# Patient Record
Sex: Male | Born: 1940
Health system: Southern US, Community
[De-identification: ages and names within clinical notes are randomized; demographics above are authoritative.]

## PROBLEM LIST (undated history)

## (undated) DIAGNOSIS — G2581 Restless legs syndrome: Secondary | ICD-10-CM

## (undated) DIAGNOSIS — H409 Unspecified glaucoma: Secondary | ICD-10-CM

## (undated) DIAGNOSIS — C189 Malignant neoplasm of colon, unspecified: Secondary | ICD-10-CM

## (undated) DIAGNOSIS — M4807 Spinal stenosis, lumbosacral region: Secondary | ICD-10-CM

## (undated) DIAGNOSIS — R011 Cardiac murmur, unspecified: Secondary | ICD-10-CM

## (undated) HISTORY — PX: HIP SURGERY: SHX245

## (undated) HISTORY — DX: Spinal stenosis, lumbosacral region: M48.07

## (undated) HISTORY — DX: Unspecified glaucoma: H40.9

## (undated) HISTORY — DX: Malignant neoplasm of colon, unspecified: C18.9

## (undated) HISTORY — PX: HERNIA REPAIR: SHX51

## (undated) HISTORY — DX: Restless legs syndrome: G25.81

---

## 2011-05-07 HISTORY — PX: BACK SURGERY: SHX140

## 2013-02-10 ENCOUNTER — Other Ambulatory Visit: Payer: Self-pay | Admitting: Orthopaedic Surgery

## 2013-02-10 DIAGNOSIS — M5136 Other intervertebral disc degeneration, lumbar region: Secondary | ICD-10-CM

## 2013-02-22 ENCOUNTER — Ambulatory Visit
Admission: RE | Admit: 2013-02-22 | Discharge: 2013-02-22 | Disposition: A | Discharge status: Home / Self Care | Payer: Medicare Other | Source: Ambulatory Visit | Attending: Orthopaedic Surgery | Admitting: Orthopaedic Surgery

## 2013-02-22 DIAGNOSIS — M5136 Other intervertebral disc degeneration, lumbar region: Secondary | ICD-10-CM

## 2013-02-22 MED ORDER — GADOBENATE DIMEGLUMINE 529 MG/ML IV SOLN
15.0000 mL | Freq: Once | INTRAVENOUS | Status: AC | PRN
  Administered 2013-02-22: 15 mL via INTRAVENOUS

## 2014-01-25 ENCOUNTER — Other Ambulatory Visit: Payer: Self-pay | Admitting: Orthopedic Surgery

## 2014-02-23 ENCOUNTER — Encounter (HOSPITAL_COMMUNITY): Admission: RE | Payer: Self-pay | Source: Ambulatory Visit

## 2014-02-23 ENCOUNTER — Inpatient Hospital Stay (HOSPITAL_COMMUNITY): Admission: RE | Admit: 2014-02-23 | Payer: Medicare Other | Source: Ambulatory Visit | Admitting: Orthopedic Surgery

## 2014-02-23 SURGERY — ARTHROPLASTY, HIP, TOTAL, ANTERIOR APPROACH
Anesthesia: Choice | Site: Hip | Laterality: Right

## 2019-05-20 DIAGNOSIS — H401131 Primary open-angle glaucoma, bilateral, mild stage: Secondary | ICD-10-CM | POA: Diagnosis not present

## 2019-06-08 DIAGNOSIS — K921 Melena: Secondary | ICD-10-CM | POA: Diagnosis not present

## 2019-06-09 DIAGNOSIS — K921 Melena: Secondary | ICD-10-CM | POA: Diagnosis not present

## 2019-06-16 DIAGNOSIS — C187 Malignant neoplasm of sigmoid colon: Secondary | ICD-10-CM | POA: Diagnosis not present

## 2019-06-16 DIAGNOSIS — Z800 Family history of malignant neoplasm of digestive organs: Secondary | ICD-10-CM | POA: Diagnosis not present

## 2019-06-16 DIAGNOSIS — K635 Polyp of colon: Secondary | ICD-10-CM | POA: Diagnosis not present

## 2019-06-16 DIAGNOSIS — K6389 Other specified diseases of intestine: Secondary | ICD-10-CM | POA: Diagnosis not present

## 2019-06-16 DIAGNOSIS — D125 Benign neoplasm of sigmoid colon: Secondary | ICD-10-CM | POA: Diagnosis not present

## 2019-06-16 DIAGNOSIS — K921 Melena: Secondary | ICD-10-CM | POA: Diagnosis not present

## 2019-06-16 DIAGNOSIS — Z1211 Encounter for screening for malignant neoplasm of colon: Secondary | ICD-10-CM | POA: Diagnosis not present

## 2019-06-17 DIAGNOSIS — C187 Malignant neoplasm of sigmoid colon: Secondary | ICD-10-CM | POA: Diagnosis not present

## 2019-06-17 DIAGNOSIS — C189 Malignant neoplasm of colon, unspecified: Secondary | ICD-10-CM | POA: Diagnosis not present

## 2019-06-18 DIAGNOSIS — C187 Malignant neoplasm of sigmoid colon: Secondary | ICD-10-CM | POA: Diagnosis not present

## 2019-06-21 DIAGNOSIS — Z1331 Encounter for screening for depression: Secondary | ICD-10-CM | POA: Diagnosis not present

## 2019-06-21 DIAGNOSIS — Z0000 Encounter for general adult medical examination without abnormal findings: Secondary | ICD-10-CM | POA: Diagnosis not present

## 2019-06-21 DIAGNOSIS — Z6827 Body mass index (BMI) 27.0-27.9, adult: Secondary | ICD-10-CM | POA: Diagnosis not present

## 2019-06-21 DIAGNOSIS — Z01818 Encounter for other preprocedural examination: Secondary | ICD-10-CM | POA: Diagnosis not present

## 2019-06-21 DIAGNOSIS — I340 Nonrheumatic mitral (valve) insufficiency: Secondary | ICD-10-CM | POA: Diagnosis not present

## 2019-06-22 DIAGNOSIS — R011 Cardiac murmur, unspecified: Secondary | ICD-10-CM | POA: Diagnosis not present

## 2019-06-22 DIAGNOSIS — I340 Nonrheumatic mitral (valve) insufficiency: Secondary | ICD-10-CM | POA: Diagnosis not present

## 2019-06-22 DIAGNOSIS — I350 Nonrheumatic aortic (valve) stenosis: Secondary | ICD-10-CM | POA: Diagnosis not present

## 2019-06-22 DIAGNOSIS — Z1159 Encounter for screening for other viral diseases: Secondary | ICD-10-CM | POA: Diagnosis not present

## 2019-06-22 DIAGNOSIS — I361 Nonrheumatic tricuspid (valve) insufficiency: Secondary | ICD-10-CM | POA: Diagnosis not present

## 2019-06-25 ENCOUNTER — Other Ambulatory Visit: Payer: Self-pay

## 2019-06-25 ENCOUNTER — Ambulatory Visit (INDEPENDENT_AMBULATORY_CARE_PROVIDER_SITE_OTHER): Payer: Medicare PPO | Admitting: Cardiology

## 2019-06-25 ENCOUNTER — Encounter: Payer: Self-pay | Admitting: Cardiology

## 2019-06-25 VITALS — BP 130/90 | HR 94 | Ht 68.0 in | Wt 182.0 lb

## 2019-06-25 DIAGNOSIS — I493 Ventricular premature depolarization: Secondary | ICD-10-CM | POA: Diagnosis not present

## 2019-06-25 DIAGNOSIS — I350 Nonrheumatic aortic (valve) stenosis: Secondary | ICD-10-CM

## 2019-06-25 DIAGNOSIS — C187 Malignant neoplasm of sigmoid colon: Secondary | ICD-10-CM | POA: Diagnosis not present

## 2019-06-25 DIAGNOSIS — N401 Enlarged prostate with lower urinary tract symptoms: Secondary | ICD-10-CM | POA: Diagnosis not present

## 2019-06-25 NOTE — Patient Instructions (Signed)
Medication Instructions:  Your physician recommends that you continue on your current medications as directed. Please refer to the Current Medication list given to you today.  *If you need a refill on your cardiac medications before your next appointment, please call your pharmacy*  Lab Work: None  If you have labs (blood work) drawn today and your tests are completely normal, you will receive your results only by: Marland Kitchen MyChart Message (if you have MyChart) OR . A paper copy in the mail If you have any lab test that is abnormal or we need to change your treatment, we will call you to review the results.  Testing/Procedures: None  Follow-Up: At [facility], you and your health needs are our priority.  As part of our continuing mission to provide you with exceptional heart care, we have created designated Provider Care Teams.  These Care Teams include your primary Cardiologist (physician) and Advanced Practice Providers (APPs -  Physician Assistants and Nurse Practitioners) who all work together to provide you with the care you need, when you need it.  Your next appointment:   1 month(s)  The format for your next appointment:   In Person  Provider:   Berniece Salines, DO  Other Instructions YOU ARE BEING REFERRED TO DR Primrose. THEY WILL CONTACT YOU WITH A DATE AND TIME FOR YOUR APPOINTMENT.

## 2019-06-25 NOTE — Progress Notes (Signed)
Cardiology Office Note:    Date:  06/26/2019   ID:  Richard Washington, DOB 06-26-1940, MRN [id number]  PCP:  Mateo Flow, MD  Cardiologist:  Berniece Salines, DO  Electrophysiologist:  None   Referring MD: No ref. provider found   Chief Complaint  Patient presents with  . Pre-op Exam    To get cancerous mass removed from colon    History of Present Illness:    Richard Washington is a 79 y.o. male presents today to be evaluated for preoperative clearance.  Per patient and his wife he has scheduled surgery scheduled for June 28, 2019 due to a colon mass.  Per records on June 21, 2019 he had a colonoscopy for hematochezia which revealed sigmoid adenocarcinoma.  Other medical history includes glaucoma, restless leg syndrome, insomnia and history of melanoma.  The patient was referred by his PCP due to echocardiogram which done at [facility] showed evidence of severe aortic stenosis.  He denies any chest pain, shortness of breath, nausea, vomiting any dizziness.  Past Medical History:  Diagnosis Date  . Carcinoma of colon ([place])   . Glaucoma   . Lumbosacral stenosis   . Restless leg     Past Surgical History:  Procedure Laterality Date  . BACK SURGERY  2013   Stenosis in the back at [place] 2001 and 2013  . HIP SURGERY Bilateral    2015 and 2016    Current Medications: No outpatient medications have been marked as taking for the 06/25/19 encounter (Office Visit) with Berniece Salines, DO.     Allergies:   Patient has no known allergies.   Social History   Socioeconomic History  . Marital status: Married    Spouse name: Not on file  . Number of children: Not on file  . Years of education: Not on file  . Highest education level: Not on file  Occupational History  . Not on file  Tobacco Use  . Smoking status: Never Smoker  . Smokeless tobacco: Former Systems developer    Types: Chew  Substance and Sexual Activity  . Alcohol use: Never  . Drug use:  Never  . Sexual activity: Not on file  Other Topics Concern  . Not on file  Social History Narrative  . Not on file   Social Determinants of Health   Financial Resource Strain:   . Difficulty of Paying Living Expenses: Not on file  Food Insecurity:   . Worried About Charity fundraiser in the Last Year: Not on file  . Ran Out of Food in the Last Year: Not on file  Transportation Needs:   . Lack of Transportation (Medical): Not on file  . Lack of Transportation (Non-Medical): Not on file  Physical Activity:   . Days of Exercise per Week: Not on file  . Minutes of Exercise per Session: Not on file  Stress:   . Feeling of Stress : Not on file  Social Connections:   . Frequency of Communication with Friends and Family: Not on file  . Frequency of Social Gatherings with Friends and Family: Not on file  . Attends Religious Services: Not on file  . Active Member of Clubs or Organizations: Not on file  . Attends Archivist Meetings: Not on file  . Marital Status: Not on file     Family History: The patient's family history includes Arrhythmia in his mother; Arthritis in his father and mother;  Colon cancer in his father; Heart attack in his father; Multiple sclerosis in his sister.  ROS:   Review of Systems  Constitution: Negative for decreased appetite, fever and weight gain.  HENT: Negative for congestion, ear discharge, hoarse voice and sore throat.   Eyes: Negative for discharge, redness, vision loss in right eye and visual halos.  Cardiovascular: Negative for chest pain, dyspnea on exertion, leg swelling, orthopnea and palpitations.  Respiratory: Negative for cough, hemoptysis, shortness of breath and snoring.   Endocrine: Negative for heat intolerance and polyphagia.  Hematologic/Lymphatic: Negative for bleeding problem. Does not bruise/bleed easily.  Skin: Negative for flushing, nail changes, rash and suspicious lesions.  Musculoskeletal: Negative for arthritis,  joint pain, muscle cramps, myalgias, neck pain and stiffness.  Gastrointestinal: Negative for abdominal pain, bowel incontinence, diarrhea and excessive appetite.  Genitourinary: Negative for decreased libido, genital sores and incomplete emptying.  Neurological: Negative for brief paralysis, focal weakness, headaches and loss of balance.  Psychiatric/Behavioral: Negative for altered mental status, depression and suicidal ideas.  Allergic/Immunologic: Negative for HIV exposure and persistent infections.    EKGs/Labs/Other Studies Reviewed:    The following studies were reviewed today:   EKG:  The ekg ordered today demonstrates sinus rhythm, heart rate 85 bpm, frequent PVCs with left axis deviation.  No prior EKG for comparison.  Transthoracic echocardiogram showed evidence of normal LV size, left ventricle wall thickness was normal, left ventricular systolic function was 55 to 60%.  Diastolic pattern indicates impaired relaxation.  Left ventricle is moderately enlarged.  Left atrium is moderately dilated.  Right atrium is normal size.  Interatrial septum intact.  Aortic valve is bicuspid and severely thickened.  There is doming of the aortic valve leaflet.  Severe aortic stenosis.  Mean gradient 41, aortic valve area 0.78, peak velocity 4.28 m/s.  Mitral valve appeared normal.  Normal regurgitation.  Mitral valve pressure half-time 1.21 cm.  Tricuspid valve normal.  Mitral valve regurgitation.  Pulmonic valve normal.  Trace pulmonary regurgitation.  Exercise normal ascending urinary visualized.  Normal inferior vena cava.  No pericardial effusion.  Recent Labs: No results found for requested labs within last 8760 hours.  Recent Lipid Panel No results found for: CHOL, TRIG, HDL, CHOLHDL, VLDL, LDLCALC, LDLDIRECT  Physical Exam:    VS:  BP 130/90 (BP Location: Right Arm, Patient Position: Sitting, Cuff Size: Normal)   Pulse 94   Ht 5\' 8"  (1.727 m)   Wt 182 lb (82.6 kg)   SpO2 98%   BMI  27.67 kg/m     Wt Readings from Last 3 Encounters:  06/25/19 182 lb (82.6 kg)     GEN: Well nourished, well developed in no acute distress HEENT: Normal NECK: No JVD; No carotid bruits LYMPHATICS: No lymphadenopathy CARDIAC: S1S2 noted,RRR, 3/6  Mid-late systolic ejection murmur, rubs, gallops RESPIRATORY:  Clear to auscultation without rales, wheezing or rhonchi  ABDOMEN: Soft, non-tender, non-distended, +bowel sounds, no guarding. EXTREMITIES: No edema, No cyanosis, no clubbing MUSCULOSKELETAL:  No deformity  SKIN: Warm and dry NEUROLOGIC:  Alert and oriented x 3, non-focal PSYCHIATRIC:  Normal affect, good insight  ASSESSMENT:    1. Severe calcific aortic stenosis   2. PVC's (premature ventricular contractions)    PLAN:     The patient does have severe aortic stenosis which I believe needs urgent attention. He denies symptoms but he may not be active enough to solicit symptoms. I am concern about his upcoming surgery. I did speak with his surgeon (Dr. Lilia Pro). If  he needs this surgery immediately he should have this done at a tertiary care center.   In the meantime, I have placed an urgent referral with our structural clinic to be evaluated for TAVR.   I explained this to the patient and his wife. At this time they would like to at least go for a visit to the structural clinic. I also informed them that I had spoken to his surgeon as well.   PVC was noted on the ekg in the office today. We will continue to monitor.   His blood pressure is acceptable for now.  The patient is in agreement with the above plan. The patient left the office in stable condition.  The patient will follow up in 1 months or sooner if needed.    Medication Adjustments/Labs and Tests Ordered: Current medicines are reviewed at length with the patient today.  Concerns regarding medicines are outlined above.  Orders Placed This Encounter  Procedures  . Ambulatory referral to Structural Heart/Valve  Clinic (only at [place])  . EKG 12-Lead   No orders of the defined types were placed in this encounter.   Patient Instructions  Medication Instructions:  Your physician recommends that you continue on your current medications as directed. Please refer to the Current Medication list given to you today.  *If you need a refill on your cardiac medications before your next appointment, please call your pharmacy*  Lab Work: None  If you have labs (blood work) drawn today and your tests are completely normal, you will receive your results only by: Marland Kitchen MyChart Message (if you have MyChart) OR . A paper copy in the mail If you have any lab test that is abnormal or we need to change your treatment, we will call you to review the results.  Testing/Procedures: None  Follow-Up: At [facility], you and your health needs are our priority.  As part of our continuing mission to provide you with exceptional heart care, we have created designated Provider Care Teams.  These Care Teams include your primary Cardiologist (physician) and Advanced Practice Providers (APPs -  Physician Assistants and Nurse Practitioners) who all work together to provide you with the care you need, when you need it.  Your next appointment:   1 month(s)  The format for your next appointment:   In Person  Provider:   Berniece Salines, DO  Other Instructions YOU ARE BEING REFERRED TO DR [place]. THEY WILL CONTACT YOU WITH A DATE AND TIME FOR YOUR APPOINTMENT.     Adopting a Healthy Lifestyle.  Know what a healthy weight is for you (roughly BMI <25) and aim to maintain this   Aim for 7+ servings of fruits and vegetables daily   65-80+ fluid ounces of water or unsweet tea for healthy kidneys   Limit to max 1 drink of alcohol per day; avoid smoking/tobacco   Limit animal fats in diet for cholesterol and heart health - choose grass fed whenever available   Avoid highly processed  foods, and foods high in saturated/trans fats   Aim for low stress - take time to unwind and care for your mental health   Aim for 150 min of moderate intensity exercise weekly for heart health, and weights twice weekly for bone health   Aim for 7-9 hours of sleep daily   When it comes to diets, agreement about the perfect plan isnt easy to find, even among the experts. Experts at the [facility]  [employer] developed an idea known as the Healthy Eating Plate. Just imagine a plate divided into logical, healthy portions.   The emphasis is on diet quality:   Load up on vegetables and fruits - one-half of your plate: Aim for color and variety, and remember that potatoes dont count.   Go for whole grains - one-quarter of your plate: Whole wheat, barley, wheat berries, quinoa, oats, brown rice, and foods made with them. If you want pasta, go with whole wheat pasta.   Protein power - one-quarter of your plate: Fish, chicken, beans, and nuts are all healthy, versatile protein sources. Limit red meat.   The diet, however, does go beyond the plate, offering a few other suggestions.   Use healthy plant oils, such as olive, canola, soy, corn, sunflower and peanut. Check the labels, and avoid partially hydrogenated oil, which have unhealthy trans fats.   If youre thirsty, drink water. Coffee and tea are good in moderation, but skip sugary drinks and limit milk and dairy products to one or two daily servings.   The type of carbohydrate in the diet is more important than the amount. Some sources of carbohydrates, such as vegetables, fruits, whole grains, and beans-are healthier than others.   Finally, stay active  Signed, Berniece Salines, DO  06/26/2019 10:14 AM    [place]

## 2019-06-26 DIAGNOSIS — I493 Ventricular premature depolarization: Secondary | ICD-10-CM | POA: Insufficient documentation

## 2019-06-26 DIAGNOSIS — I350 Nonrheumatic aortic (valve) stenosis: Secondary | ICD-10-CM | POA: Insufficient documentation

## 2019-06-28 ENCOUNTER — Telehealth: Payer: Self-pay | Admitting: Cardiovascular Disease

## 2019-06-28 NOTE — Telephone Encounter (Signed)
New message   Patient's wife is calling to get a referral setup for patient that is a structural heart valve clinic referral. Please call the patient.

## 2019-06-29 NOTE — Telephone Encounter (Signed)
Pt scheduled for Structural Heart evaluation on 07/02/2019 with Dr Angelena Form.

## 2019-07-02 ENCOUNTER — Ambulatory Visit: Payer: Medicare PPO | Admitting: Cardiovascular Disease

## 2019-07-02 ENCOUNTER — Encounter: Payer: Self-pay | Admitting: Cardiovascular Disease

## 2019-07-02 ENCOUNTER — Encounter (HOSPITAL_COMMUNITY): Payer: Self-pay | Admitting: Surgery

## 2019-07-02 ENCOUNTER — Other Ambulatory Visit: Payer: Self-pay

## 2019-07-02 VITALS — BP 146/80 | HR 92 | Ht 68.0 in | Wt 181.2 lb

## 2019-07-02 DIAGNOSIS — I350 Nonrheumatic aortic (valve) stenosis: Secondary | ICD-10-CM

## 2019-07-02 DIAGNOSIS — C187 Malignant neoplasm of sigmoid colon: Secondary | ICD-10-CM | POA: Insufficient documentation

## 2019-07-02 HISTORY — DX: Nonrheumatic aortic (valve) stenosis: I35.0

## 2019-07-02 NOTE — Patient Instructions (Signed)
We will follow up with you after your surgery!

## 2019-07-02 NOTE — Progress Notes (Signed)
Structural Heart Clinic Consult Note  Chief Complaint  Patient presents with  . New Patient (Initial Visit)    Severe aortic stenosis    History of Present Illness: 79 yo male with history of glaucoma, restless leg syndrome, melanoma, recent diagnosis of colon adenocarcinoma and new finding of severe aortic stenosis who is here today as a new consult in the structural heart clinic to discuss his aortic stenosis and possible TAVR. He had hematochezia and colonoscopy showed a sigmoid adenocarcinoma. Echo at [facility] per records showed normal LV systolic function with [id number]. The aortic valve appeared to be bicuspid with severely thickened leaflets with restricted motion and severe aortic stenosis (mean gradient 41 mmHg, AVA 0.78 cm2). He was seen in the [facility] office in [place] by Dr. Harriet Masson on 06/25/19. He had planned surgery for removal of his sigmoid colon mass but this was cancelled due to his aortic stenosis.   He tells me today that he has no chest pain, dyspnea, dizziness, near syncope, syncope or lower extremity edema. He worked in his yard all weekend with no issues. He has had no further GI bleeding. He has no active dental issues and goes to the dentist every six months. He lives in [place], [place]. He is here today with his wife. He is retired from [employer].   Primary Care Physician: Mateo Flow, MD Primary Cardiologist: Berniece Salines, MD Referring Cardiologist: Berniece Salines, MD Surgeon: Andrez Grime, MD  Past Medical History:  Diagnosis Date  . Aortic stenosis   . Carcinoma of colon ([place])   . Glaucoma   . Lumbosacral stenosis   . Restless leg     Past Surgical History:  Procedure Laterality Date  . BACK SURGERY  2013   Stenosis in the back at [place] 2001 and 2013  . HIP SURGERY Bilateral    2015 and 2016    Current Outpatient Medications  Medication Sig Dispense Refill  . latanoprost (XALATAN) 0.005 % ophthalmic  solution      No current facility-administered medications for this visit.    No Known Allergies  Social History   Socioeconomic History  . Marital status: Married    Spouse name: Not on file  . Number of children: 3  . Years of education: Not on file  . Highest education level: Not on file  Occupational History  . Occupation: Retired-Worked for [employer]  . Smoking status: Never Smoker  . Smokeless tobacco: Former Systems developer    Types: Chew  Substance and Sexual Activity  . Alcohol use: Never  . Drug use: Never  . Sexual activity: Not on file  Other Topics Concern  . Not on file  Social History Narrative  . Not on file   Social Determinants of Health   Financial Resource Strain:   . Difficulty of Paying Living Expenses: Not on file  Food Insecurity:   . Worried About Charity fundraiser in the Last Year: Not on file  . Ran Out of Food in the Last Year: Not on file  Transportation Needs:   . Lack of Transportation (Medical): Not on file  . Lack of Transportation (Non-Medical): Not on file  Physical Activity:   . Days of Exercise per Week: Not on file  . Minutes of Exercise per Session: Not on file  Stress:   . Feeling of Stress : Not on file  Social Connections:   . Frequency  of Communication with Friends and Family: Not on file  . Frequency of Social Gatherings with Friends and Family: Not on file  . Attends Religious Services: Not on file  . Active Member of Clubs or Organizations: Not on file  . Attends Archivist Meetings: Not on file  . Marital Status: Not on file  Intimate Partner Violence:   . Fear of Current or Ex-Partner: Not on file  . Emotionally Abused: Not on file  . Physically Abused: Not on file  . Sexually Abused: Not on file    Family History  Problem Relation Age of Onset  . Arrhythmia Mother   . Arthritis Mother   . Heart attack Father   . Colon cancer Father   . Arthritis Father   . Multiple sclerosis Sister      Review of Systems:  As stated in the HPI and otherwise negative.   BP (!) 146/80   Pulse 92   Ht 5\' 8"  (1.727 m)   Wt 181 lb 3.2 oz (82.2 kg)   SpO2 96%   BMI 27.55 kg/m   Physical Examination: General: Well developed, well nourished, NAD  HEENT: OP clear, mucus membranes moist  SKIN: warm, dry. No rashes. Neuro: No focal deficits  Musculoskeletal: Muscle strength 5/5 all ext  Psychiatric: Mood and affect normal  Neck: No JVD, no carotid bruits, no thyromegaly, no lymphadenopathy.  Lungs:Clear bilaterally, no wheezes, rhonci, crackles Cardiovascular: Regular rate and rhythm. Loud, harsh, late peaking systolic murmur.  Abdomen:Soft. Bowel sounds present. Non-tender.  Extremities: No lower extremity edema. Pulses are 2 + in the bilateral DP/PT.  EKG:  EKG is not ordered today. The ekg ordered today demonstrates   Echo February 2021: Transthoracic echocardiogram showed evidence of normal LV size, left ventricle wall thickness was normal, left ventricular systolic function was 55 to 60%.  Diastolic pattern indicates impaired relaxation.  Left ventricle is moderately enlarged.  Left atrium is moderately dilated.  Right atrium is normal size.  Interatrial septum intact.  Aortic valve is bicuspid and severely thickened.  There is doming of the aortic valve leaflet.  Severe aortic stenosis.  Mean gradient 41, aortic valve area 0.78, peak velocity 4.28 m/s.  Mitral valve appeared normal.  Normal regurgitation.  Mitral valve pressure half-time 1.21 cm.  Tricuspid valve normal.  Mitral valve regurgitation.  Pulmonic valve normal.  Trace pulmonary regurgitation.  Exercise normal ascending urinary visualized.  Normal inferior vena cava.  No pericardial effusion  Recent Labs: No results found for requested labs within last 8760 hours.   Lipid Panel No results found for: CHOL, TRIG, HDL, CHOLHDL, VLDL, LDLCALC, LDLDIRECT   Wt Readings from Last 3 Encounters:  07/02/19 181 lb 3.2 oz (82.2  kg)  06/25/19 182 lb (82.6 kg)     Other studies Reviewed: Additional studies/ records that were reviewed today include: echo report, office notes. Review of the above records demonstrates: AS   Assessment and Plan:   1. Severe Aortic Valve Stenosis: He has severe aortic valve stenosis but he is asymptomatic at this time. His stenosis is not critical based on the available data.  He has no concerning symptoms. I have discussed his aortic stenosis with the patient and his wife. He would clearly benefit from AVR but this is complicated by his colon cancer.  I have also reviewed his case with DR. Tobb and Dr. Lilia Pro [facility]). It would be optimal to proceed with resection of his mass before TAVR given the recent  GI bleeding and the fact that he will need anti-platelet therapy post TAVR. He would also benefit from expediting the mass removal and planning for TAVR would delay this. Following TAVR, he would need at least one month of anti-platelet therapy.    I have reviewed the natural history of aortic stenosis with the patient and their family members  who are present today. We have discussed the limitations of medical therapy and the poor prognosis associated with symptomatic aortic stenosis. We have reviewed potential treatment options, including palliative medical therapy, conventional surgical aortic valve replacement, and transcatheter aortic valve replacement. We discussed treatment options in the context of the patient's specific comorbid medical conditions.   I will have him proceed with his planned colon mass removal. I have spoken to Dr. Lilia Pro and I think it would be best if this were done at [facility] with a cardiac anesthesia team on the case. The non-cardiac surgery can be performed at an acceptable risk in the gentleman with severe aortic stenosis. His aortic stenosis is not critical. The cardiac team will be available to assist as needed.   I would anticipate seeing him back  in my office several weeks post surgery and then starting the workup for TAVR.   Current medicines are reviewed at length with the patient today.  The patient does not have concerns regarding medicines.  The following changes have been made:  no change  Labs/ tests ordered today include:  No orders of the defined types were placed in this encounter.    Disposition:   FU with me 3-4 weeks post colon surgery.    Signed, Lauree Chandler, MD 07/02/2019 2:43 PM    [address], [place], [place]  [id number] Phone: [phone]; Fax: [phone]

## 2019-07-05 ENCOUNTER — Ambulatory Visit: Payer: Self-pay | Admitting: Surgery

## 2019-07-05 DIAGNOSIS — I350 Nonrheumatic aortic (valve) stenosis: Secondary | ICD-10-CM | POA: Diagnosis not present

## 2019-07-05 DIAGNOSIS — C187 Malignant neoplasm of sigmoid colon: Secondary | ICD-10-CM | POA: Diagnosis not present

## 2019-07-05 NOTE — H&P (Signed)
[place] Documented: 07/05/2019 2:43 PM Location: [facility] Patient #: [id number] DOB: 04/20/1941 Married / Language: [place] / Race: White Male  History of Present Illness Richard Hector MD; 07/05/2019 5:04 PM) The patient is a 79 year old male who presents with colorectal cancer. Note for "Colorectal cancer": ` ` ` Patient sent for surgical consultation at the request of Dr [place]  Chief Complaint: Sigmoid colon cancer in the setting of severe calcific aortic stenosis. ` ` The patient is a rather active elderly male. He comes today with his wife History of glaucoma, restless leg syndrome, melanoma. Had hematochezia and underwent colonoscopy. Sigmoid cancer revealed. Dr. Lilia [place] with surgery detected a heart murmur and sent patient for evaluation. Patient had good left ventricular systolic function with an LVEF of 55-60 percent, his aortic valve was found to be bicuspid with severely thickened leaflet. Aortic stenosis with a mean gradient of 41 mmHg. Seen by Dr. Harriet [place] down in [place] and referred up to see Dr. Angelena [place] at the [facility] through [place]. Seen last week. Patient has had six surgeries (back, inguinal hernia ,and hip surgeries) in the past decade without incident. Patient does recall his mother was diagnosed with colon cancer when she was older. He walks the dog's and a daily basis. Thinks he doesn't least a 20 minute walk. He still occasionally gets some left calf and foot numbness when he walks. Doesn't related to his back or hip surgery.  No personal nor family history of inflammatory bowel disease, irritable bowel syndrome, allergy such as Celiac Sprue, dietary/dairy problems, colitis, ulcers nor gastritis. No recent sick contacts/gastroenteritis. No travel outside the country. No changes in diet. No dysphagia to solids or liquids. No significant heartburn or reflux. No hematochezia, hematemesis, coffee ground emesis.  No evidence of prior gastric/peptic ulceration.   (Review of systems as stated in this history (HPI) or in the review of systems. Otherwise all other 12 point ROS are negative) ` ` `  This patient encounter took 55 minutes today to perform the following: obtain history, perform exam, review outside records, interpret tests & imaging, counsel the patient on their diagnosis; and, document this encounter, including findings & plan in the electronic health record ([place]).   Past Surgical History [place], [place]; 07/05/2019 2:43 PM) Spinal Surgery - Lower Back  Diagnostic Studies History ([place], [place]; 07/05/2019 2:43 PM) Colonoscopy within last year  Allergies ([place]; 07/05/2019 2:44 PM) No Known Allergies [07/05/2019]: No Known Drug Allergies [07/05/2019]: Allergies Reconciled  Medication History ([place]; 07/05/2019 2:44 PM) Latanoprost (Ophthalmic) Specific strength unknown - Active. Medications Reconciled  Social History [place]; 07/05/2019 2:43 PM) Caffeine use Carbonated beverages, Coffee, Tea. No alcohol use No drug use Tobacco use Never smoker.  Family History [place], [place]; 07/05/2019 2:43 PM) Arthritis Sister. Colon Cancer Father. Heart Disease Father, Mother.  Other Problems ([place], [place]; 07/05/2019 2:43 PM) Back Pain Heart murmur Hemorrhoids Inguinal Hernia Ventral Hernia Repair     Review of Systems ([place]; 07/05/2019 2:43 PM) General Not Present- Appetite Loss, Chills, Fatigue, Fever, Night Sweats, Weight Gain and Weight Loss. Skin Not Present- Change in Wart/Mole, Dryness, Hives, Jaundice, New Lesions, Non-Healing Wounds, Rash and Ulcer. HEENT Not Present- Earache, Hearing Loss, Hoarseness, Nose Bleed, Oral Ulcers, Ringing in the Ears, Seasonal Allergies, Sinus Pain, Sore Throat, Visual Disturbances, Wears glasses/contact lenses and Yellow Eyes. Respiratory Not Present- Bloody sputum,  Chronic Cough, Difficulty Breathing, Snoring and  Wheezing. Breast Not Present- Breast Mass, Breast Pain, Nipple Discharge and Skin Changes. Cardiovascular Not Present- Chest Pain, Difficulty Breathing Lying Down, Leg Cramps, Palpitations, Rapid Heart Rate, Shortness of Breath and Swelling of Extremities. Gastrointestinal Present- Bloody Stool, Change in Bowel Habits and Hemorrhoids. Not Present- Abdominal Pain, Bloating, Chronic diarrhea, Constipation, Difficulty Swallowing, Excessive gas, Gets full quickly at meals, Indigestion, Nausea, Rectal Pain and Vomiting. Male Genitourinary Not Present- Blood in Urine, Change in Urinary Stream, Frequency, Impotence, Nocturia, Painful Urination, Urgency and Urine Leakage. Musculoskeletal Not Present- Back Pain, Joint Pain, Joint Stiffness, Muscle Pain, Muscle Weakness and Swelling of Extremities. Neurological Present- Numbness. Not Present- Decreased Memory, Fainting, Headaches, Seizures, Tingling, Tremor, Trouble walking and Weakness. Psychiatric Not Present- Anxiety, Bipolar, Change in Sleep Pattern, Depression, Fearful and Frequent crying. Endocrine Not Present- Cold Intolerance, Excessive Hunger, Hair Changes, Heat Intolerance, Hot flashes and New Diabetes. Hematology Not Present- Blood Thinners, Easy Bruising, Excessive bleeding, Gland problems, HIV and Persistent Infections.  Vitals ([place]; 07/05/2019 2:45 PM) 07/05/2019 2:44 PM Weight: 184.13 lb Height: 68in Body Surface Area: 1.97 m Body Mass Index: 28 kg/m  Temp.: 98.80F  Pulse: 93 (Regular)  BP: 132/72 (Sitting, Left Arm, Standard)        Physical Exam Richard Hector MD; 07/05/2019 5:03 PM)  General Mental Status-Alert. General Appearance-Not in acute distress, Not Sickly. Orientation-Oriented X3. Hydration-Well hydrated. Voice-Normal.  Integumentary Global Assessment Upon inspection and palpation of skin surfaces of the - Axillae: non-tender, no  inflammation or ulceration, no drainage. and Distribution of scalp and body hair is normal. General Characteristics Temperature - normal warmth is noted.  Head and Neck Head-normocephalic, atraumatic with no lesions or palpable masses. Face Global Assessment - atraumatic, no absence of expression. Neck Global Assessment - no abnormal movements, no bruit auscultated on the right, no bruit auscultated on the left, no decreased range of motion, non-tender. Trachea-midline. Thyroid Gland Characteristics - non-tender.  Eye Eyeball - Left-Extraocular movements intact, No Nystagmus - Left. Eyeball - Right-Extraocular movements intact, No Nystagmus - Right. Cornea - Left-No Hazy - Left. Cornea - Right-No Hazy - Right. Sclera/Conjunctiva - Left-No scleral icterus, No Discharge - Left. Sclera/Conjunctiva - Right-No scleral icterus, No Discharge - Right. Pupil - Left-Direct reaction to light normal. Pupil - Right-Direct reaction to light normal.  ENMT Ears Pinna - Left - no drainage observed, no generalized tenderness observed. Pinna - Right - no drainage observed, no generalized tenderness observed. Nose and Sinuses External Inspection of the Nose - no destructive lesion observed. Inspection of the nares - Left - quiet respiration. Inspection of the nares - Right - quiet respiration. Mouth and Throat Lips - Upper Lip - no fissures observed, no pallor noted. Lower Lip - no fissures observed, no pallor noted. Nasopharynx - no discharge present. Oral Cavity/Oropharynx - Tongue - no dryness observed. Oral Mucosa - no cyanosis observed. Hypopharynx - no evidence of airway distress observed.  Chest and Lung Exam Inspection Movements - Normal and Symmetrical. Accessory muscles - No use of accessory muscles in breathing. Palpation Palpation of the chest reveals - Non-tender. Auscultation Breath sounds - Normal and Clear.  Cardiovascular Auscultation Rhythm - Regular.  Murmurs & Other Heart Sounds - Auscultation of the heart reveals - No Murmurs and No Systolic Clicks.  Abdomen Inspection Inspection of the abdomen reveals - No Visible peristalsis and No Abnormal pulsations. Umbilicus - No Bleeding, No Urine drainage. Palpation/Percussion Palpation and Percussion of the abdomen reveal - Soft, Non Tender, No Rebound tenderness, No  Rigidity (guarding) and No Cutaneous hyperesthesia. Note: Abdomen soft. Nontender. Not distended. No umbilical or incisional hernias. No guarding.  Male Genitourinary Sexual Maturity Tanner 5 - Adult hair pattern and Adult penile size and shape. Note: Some thinning and right groin but no recurrent inguinal hernia. No left inguinal hernia. No lymphadenopathy. Otherwise normal external genitalia  Rectal Note: Deferred given recent colonoscopy and digital rectal exam.  Peripheral Vascular Upper Extremity Inspection - Left - No Cyanotic nailbeds - Left, Not Ischemic. Inspection - Right - No Cyanotic nailbeds - Right, Not Ischemic.  Neurologic Neurologic evaluation reveals -normal attention span and ability to concentrate, able to name objects and repeat phrases. Appropriate fund of knowledge , normal sensation and normal coordination. Mental Status Affect - not angry, not paranoid. Cranial Nerves-Normal Bilaterally. Gait-Normal.  Neuropsychiatric Mental status exam performed with findings of-able to articulate well with normal speech/language, rate, volume and coherence, thought content normal with ability to perform basic computations and apply abstract reasoning and no evidence of hallucinations, delusions, obsessions or homicidal/suicidal ideation.  Musculoskeletal Global Assessment Spine, Ribs and Pelvis - no instability, subluxation or laxity. Right Upper Extremity - no instability, subluxation or laxity. Note: Moderate kyphoscoloisos left-sided. Posture pretty good  Lymphatic Head & Neck  General  Head & Neck Lymphatics: Bilateral - Description - No Localized lymphadenopathy. Axillary  General Axillary Region: Bilateral - Description - No Localized lymphadenopathy. Femoral & Inguinal  Generalized Femoral & Inguinal Lymphatics: Left - Description - No Localized lymphadenopathy. Right - Description - No Localized lymphadenopathy.    Assessment & Plan Richard Hector MD; 07/05/2019 5:05 PM)  ADENOCARCINOMA OF SIGMOID COLON (C18.7) Impression: Adenocarcinoma around 45 cm from anal verge according to Dr. Carmie End colonoscopy. This seems to correlate with the proximal sigmoid apple core like lesion on his CAT scan. No obvious evidence of any abdominal pelvic metastatic disease.  CEA 1.4 reassuring.  He needs CT of chest for completion workup  Standard of care would be segmental colonic resection. Reasonable for a minimally invasive/robotic approach. Plan to do it was a long with anesthesia available to help sort out. If as perioperative concerns, transfer to [facility] postoperatively. Hopefully not too likely given his good exercise tolerance and tolerance of 6 surgeries in the past decade. However, we need to make sure we have backup plans ready in case.  Current Plans CT, thorax; w contrast material(s)(71260)(ACR 0 )(DSN [id number])(G-Code G1004(MG)) (Clinical Scenarios: No content available for this procedure; ; Pt needs CT Chest w/contrast for staging scan of new dx sigmoid colon cancer.)  PREOP COLON - ENCOUNTER FOR PREOPERATIVE EXAMINATION FOR GENERAL SURGICAL PROCEDURE (Z01.818)  Current Plans You are being scheduled for surgery- Our schedulers will call you.  You should hear from our office's scheduling department within 5 working days about the location, date, and time of surgery. We try to make accommodations for patient's preferences in scheduling surgery, but sometimes the OR schedule or the surgeon's schedule prevents [place] from making those accommodations.  If you have not  heard from our office [phone]) in 5 working days, call the office and ask for your surgeon's nurse.  If you have other questions about your diagnosis, plan, or surgery, call the office and ask for your surgeon's nurse.  Written instructions provided The anatomy & physiology of the digestive tract was discussed. The pathophysiology of the colon was discussed. Natural history risks without surgery was discussed. I feel the risks of no intervention will lead to serious problems that outweigh the operative risks;  therefore, I recommended a partial colectomy to remove the pathology. Minimally invasive (Robotic/Laparoscopic) & open techniques were discussed.  Risks such as bleeding, infection, abscess, leak, reoperation, possible ostomy, hernia, heart attack, death, and other risks were discussed. I noted a good likelihood this will help address the problem. Goals of post-operative recovery were discussed as well. Need for adequate nutrition, daily bowel regimen and healthy physical activity, to optimize recovery was noted as well. We will work to minimize complications. Educational materials were available as well. Questions were answered. The patient expresses understanding & wishes to proceed with surgery.  Pt Education - [facility] Colon Bowel Prep 2018 ERAS/Miralax/Antibiotics Started Neomycin Sulfate 500 MG Oral Tablet, 2 (two) Tablet SEE NOTE, #6, 07/05/2019, No Refill. Local Order: Pharmacist Notes: TAKE TWO TABLETS AT 2 PM, 3 PM, AND 10 PM THE DAY PRIOR TO SURGERY Started Flagyl 500 MG Oral Tablet, 2 (two) Tablet SEE NOTE, #6, 07/05/2019, No Refill. Local Order: Pharmacist Notes: Take at 2pm, 3pm, and 10pm the day prior to your colon operation Pt Education - Pamphlet Given - Laparoscopic Colorectal Surgery: discussed with patient and provided information. Pt Education - [facility] Colectomy post-op instructions: discussed with patient and provided information.  SEVERE CALCIFIC AORTIC  STENOSIS (I35.0) Impression: Incidentally noted calcific aortic stenosis initially heard with heart murmur followed up by echocardiogram. He's been evaluated by Dr. [place] & [facility] [place] [facility]. Because it is not critical AS the patient has good exercise tolerance, the feeling by them as to proceed with colon cancer surgery first and then they will help sort out his aortic stenosis with possible TAVR or other interventions.  Plan minimally invasive robotic resection with low threshold for cardiology consultation postoperatively.  Current Plans I recommended obtaining preoperative cardiac clearance. I am concerned about the health of the patient and the ability to tolerate the operation. Therefore, we will request clearance by cardiology to better assess operative risk & see if a reevaluation, further workup, etc is needed. Also recommendations on how medications such as for anticoagulation and blood pressure should be managed/held/restarted after surgery.  Richard Hector, MD, FACS, MASCRS Gastrointestinal and Minimally Invasive Surgery  [facility] [address] [place], [place] [address] [phone] Main / Paging [phone] Fax

## 2019-07-08 ENCOUNTER — Ambulatory Visit
Admission: RE | Admit: 2019-07-08 | Discharge: 2019-07-08 | Disposition: A | Discharge status: Home / Self Care | Payer: Self-pay | Source: Ambulatory Visit | Attending: Cardiovascular Disease | Admitting: Cardiovascular Disease

## 2019-07-08 ENCOUNTER — Other Ambulatory Visit: Payer: Self-pay

## 2019-07-08 DIAGNOSIS — I350 Nonrheumatic aortic (valve) stenosis: Secondary | ICD-10-CM

## 2019-07-09 ENCOUNTER — Other Ambulatory Visit (HOSPITAL_COMMUNITY): Payer: Self-pay | Admitting: Surgery

## 2019-07-09 ENCOUNTER — Other Ambulatory Visit: Payer: Self-pay | Admitting: Surgery

## 2019-07-09 DIAGNOSIS — C187 Malignant neoplasm of sigmoid colon: Secondary | ICD-10-CM

## 2019-07-12 ENCOUNTER — Ambulatory Visit (HOSPITAL_COMMUNITY)
Admission: RE | Admit: 2019-07-12 | Discharge: 2019-07-12 | Disposition: A | Discharge status: Home / Self Care | Payer: Medicare PPO | Source: Ambulatory Visit | Attending: Surgery | Admitting: Surgery

## 2019-07-12 ENCOUNTER — Other Ambulatory Visit: Payer: Self-pay

## 2019-07-12 ENCOUNTER — Encounter (HOSPITAL_COMMUNITY): Payer: Self-pay

## 2019-07-12 DIAGNOSIS — C189 Malignant neoplasm of colon, unspecified: Secondary | ICD-10-CM | POA: Diagnosis not present

## 2019-07-12 DIAGNOSIS — C187 Malignant neoplasm of sigmoid colon: Secondary | ICD-10-CM | POA: Insufficient documentation

## 2019-07-12 DIAGNOSIS — I2510 Atherosclerotic heart disease of native coronary artery without angina pectoris: Secondary | ICD-10-CM | POA: Diagnosis not present

## 2019-07-12 DIAGNOSIS — R911 Solitary pulmonary nodule: Secondary | ICD-10-CM | POA: Diagnosis not present

## 2019-07-12 LAB — POCT I-STAT CREATININE: Creatinine, Ser: 0.6 mg/dL — ABNORMAL LOW (ref 0.61–1.24)

## 2019-07-12 MED ORDER — SODIUM CHLORIDE (PF) 0.9 % IJ SOLN
INTRAMUSCULAR | Status: AC
  Filled 2019-07-12: qty 50

## 2019-07-12 MED ORDER — IOHEXOL 300 MG/ML  SOLN
75.0000 mL | Freq: Once | INTRAMUSCULAR | Status: AC | PRN
  Administered 2019-07-12: 75 mL via INTRAVENOUS

## 2019-07-23 ENCOUNTER — Ambulatory Visit: Payer: Medicare Other | Admitting: Cardiology

## 2019-07-30 NOTE — Patient Instructions (Signed)
DUE TO COVID-19 ONLY ONE VISITOR IS ALLOWED TO COME WITH YOU AND STAY IN THE WAITING ROOM ONLY DURING PRE OP AND PROCEDURE DAY OF SURGERY. THE 1 VISITOR MAY VISIT WITH YOU AFTER SURGERY IN YOUR PRIVATE ROOM DURING VISITING HOURS ONLY!  YOU NEED TO HAVE A COVID 19 TEST ON__4/3/21_____ @__9 :35_____, THIS TEST MUST BE DONE BEFORE SURGERY, COME  [address] [place] , [id number].  ([place]) ONCE YOUR COVID TEST IS COMPLETED, PLEASE BEGIN THE QUARANTINE INSTRUCTIONS AS OUTLINED IN YOUR HANDOUT.                [place]    Your procedure is scheduled on: 08/10/19   Report to [facility] Main  Entrance   Report to admitting at  11:15 AM     Call this number if you have problems the morning of surgery [phone]   Follow all instructions for the bowel prep from Dr. Clyda Greener office.  Drink plenty of fluids on prep day to prevent dehydration.  At 10:00 PM drink 2 clear ensure pre surgical drinks.   Remember: Do not eat food after Midnight.  You may have clear liquids until 10:00 AM the day of surgery.   CLEAR LIQUID DIET   Foods Allowed                                                                     Foods Excluded  Coffee and tea, regular and decaf                             liquids that you cannot  Plain Jell-O any favor except red or purple                                           see through such as: Fruit ices (not with fruit pulp)                                     milk, soups, orange juice  Iced Popsicles                                    All solid food Carbonated beverages, regular and diet                                    Cranberry, grape and apple juices Sports drinks like Gatorade Lightly seasoned clear broth or consume(fat free) Sugar, honey syrup   At 10:00 am drink the last clear ensure then nothing more by mouth _____________________________________________________________________     BRUSH YOUR TEETH MORNING OF  SURGERY AND RINSE YOUR MOUTH OUT, NO CHEWING GUM CANDY OR MINTS.     Take these medicines the morning of surgery with A SIP OF WATER: Gabapentin and you can use your eye drops  You may not have any metal on your body including              piercings  Do not wear jewelry,  lotions, powders or  deodorant                        Men may shave face and neck.   Do not bring valuables to the hospital. [place].  Contacts, dentures or bridgework may not be worn into surgery.       Special Instructions: N/A              Please read over the following fact sheets you were given: _____________________________________________________________________             [facility] - Preparing for Surgery  Before surgery, you can play an important role .  Because skin is not sterile, your skin needs to be as free of germs as possible.   You can reduce the number of germs on your skin by washing with CHG (chlorahexidine gluconate) soap before surgery .  CHG is an antiseptic cleaner which kills germs and bonds with the skin to continue killing germs even after washing. Please DO NOT use if you have an allergy to CHG or antibacterial soaps.   If your skin becomes reddened/irritated stop using the CHG and inform your nurse when you arrive at Short Stay. .  You may shave your face/neck.  Please follow these instructions carefully:  1.  Shower with CHG Soap the night before surgery and the  morning of Surgery.  2.  If you choose to wash your hair, wash your hair first as usual with your  normal  shampoo.  3.  After you shampoo, rinse your hair and body thoroughly to remove the  shampoo.                                        4.  Use CHG as you would any other liquid soap.  You can apply chg directly  to the skin and wash                       Gently with a scrungie or clean washcloth.  5.  Apply the CHG Soap to your body  ONLY FROM THE NECK DOWN.   Do not use on face/ open                           Wound or open sores. Avoid contact with eyes, ears mouth and genitals (private parts).                       Wash face,  Genitals (private parts) with your normal soap.             6.  Wash thoroughly, paying special attention to the area where your surgery  will be performed.  7.  Thoroughly rinse your body with warm water from the neck down.  8.  DO NOT shower/wash with your normal soap after using and rinsing off  the CHG Soap.             9.  Pat yourself dry with a clean towel.  10.  Wear clean pajamas.            11.  Place clean sheets on your bed the night of your first shower and do not  sleep with pets. Day of Surgery : Do not apply any lotions/deodorants the morning of surgery.  Please wear clean clothes to the hospital/surgery center.  FAILURE TO FOLLOW THESE INSTRUCTIONS MAY RESULT IN THE CANCELLATION OF YOUR SURGERY PATIENT SIGNATURE_________________________________  NURSE SIGNATURE__________________________________  ________________________________________________________________________   [place]  An incentive spirometer is a tool that can help keep your lungs clear and active. This tool measures how well you are filling your lungs with each breath. Taking long deep breaths may help reverse or decrease the chance of developing breathing (pulmonary) problems (especially infection) following:  A long period of time when you are unable to move or be active. BEFORE THE PROCEDURE   If the spirometer includes an indicator to show your best effort, your nurse or respiratory therapist will set it to a desired goal.  If possible, sit up straight or lean slightly forward. Try not to slouch.  Hold the incentive spirometer in an upright position. INSTRUCTIONS FOR USE  1. Sit on the edge of your bed if possible, or sit up as far as you can in bed or on a chair. 2. Hold the  incentive spirometer in an upright position. 3. Breathe out normally. 4. Place the mouthpiece in your mouth and seal your lips tightly around it. 5. Breathe in slowly and as deeply as possible, raising the piston or the ball toward the top of the column. 6. Hold your breath for 3-5 seconds or for as long as possible. Allow the piston or ball to fall to the bottom of the column. 7. Remove the mouthpiece from your mouth and breathe out normally. 8. Rest for a few seconds and repeat Steps 1 through 7 at least 10 times every 1-2 hours when you are awake. Take your time and take a few normal breaths between deep breaths. 9. The spirometer may include an indicator to show your best effort. Use the indicator as a goal to work toward during each repetition. 10. After each set of 10 deep breaths, practice coughing to be sure your lungs are clear. If you have an incision (the cut made at the time of surgery), support your incision when coughing by placing a pillow or rolled up towels firmly against it. Once you are able to get out of bed, walk around indoors and cough well. You may stop using the incentive spirometer when instructed by your caregiver.  RISKS AND COMPLICATIONS  Take your time so you do not get dizzy or light-headed.  If you are in pain, you may need to take or ask for pain medication before doing incentive spirometry. It is harder to take a deep breath if you are having pain. AFTER USE  Rest and breathe slowly and easily.  It can be helpful to keep track of a log of your progress. Your caregiver can provide you with a simple table to help with this. If you are using the spirometer at home, follow these instructions: [place] IF:   You are having difficultly using the spirometer.  You have trouble using the spirometer as often as instructed.  Your pain medication is not giving enough relief while using the spirometer.  You develop fever of 100.5 F (38.1 C) or  higher. SEEK IMMEDIATE MEDICAL CARE IF:   You cough up bloody  sputum that had not been present before.  You develop fever of 102 F (38.9 C) or greater.  You develop worsening pain at or near the incision site. MAKE SURE YOU:   Understand these instructions.  Will watch your condition.  Will get help right away if you are not doing well or get worse. Document Released: 09/02/2006 Document Revised: 07/15/2011 Document Reviewed: 11/03/2006 [facility] Patient Information 2014 [place], [place].   ________________________________________________________________________

## 2019-08-02 ENCOUNTER — Encounter (HOSPITAL_COMMUNITY)
Admission: RE | Admit: 2019-08-02 | Discharge: 2019-08-02 | Disposition: A | Discharge status: Home / Self Care | Payer: Medicare PPO | Source: Ambulatory Visit | Attending: Surgery | Admitting: Surgery

## 2019-08-02 ENCOUNTER — Other Ambulatory Visit: Payer: Self-pay

## 2019-08-02 ENCOUNTER — Encounter (HOSPITAL_COMMUNITY): Payer: Self-pay

## 2019-08-02 DIAGNOSIS — I350 Nonrheumatic aortic (valve) stenosis: Secondary | ICD-10-CM | POA: Diagnosis not present

## 2019-08-02 DIAGNOSIS — Z01818 Encounter for other preprocedural examination: Secondary | ICD-10-CM | POA: Diagnosis not present

## 2019-08-02 DIAGNOSIS — C187 Malignant neoplasm of sigmoid colon: Secondary | ICD-10-CM | POA: Diagnosis not present

## 2019-08-02 DIAGNOSIS — Z79899 Other long term (current) drug therapy: Secondary | ICD-10-CM | POA: Insufficient documentation

## 2019-08-02 DIAGNOSIS — G2581 Restless legs syndrome: Secondary | ICD-10-CM | POA: Diagnosis not present

## 2019-08-02 DIAGNOSIS — H409 Unspecified glaucoma: Secondary | ICD-10-CM | POA: Insufficient documentation

## 2019-08-02 HISTORY — DX: Cardiac murmur, unspecified: R01.1

## 2019-08-02 LAB — CBC
HCT: 42.2 % (ref 39.0–52.0)
Hemoglobin: 13.7 g/dL (ref 13.0–17.0)
MCH: 32.9 pg (ref 26.0–34.0)
MCHC: 32.5 g/dL (ref 30.0–36.0)
MCV: 101.4 fL — ABNORMAL HIGH (ref 80.0–100.0)
Platelets: 213 10*3/uL (ref 150–400)
RBC: 4.16 MIL/uL — ABNORMAL LOW (ref 4.22–5.81)
RDW: 13.5 % (ref 11.5–15.5)
WBC: 7.3 10*3/uL (ref 4.0–10.5)
nRBC: 0 % (ref 0.0–0.2)

## 2019-08-02 LAB — BASIC METABOLIC PANEL
Anion gap: 10 (ref 5–15)
BUN: 16 mg/dL (ref 8–23)
CO2: 25 mmol/L (ref 22–32)
Calcium: 9.8 mg/dL (ref 8.9–10.3)
Chloride: 106 mmol/L (ref 98–111)
Creatinine, Ser: 0.62 mg/dL (ref 0.61–1.24)
GFR calc Af Amer: >60 mL/min (ref >60)
GFR calc non Af Amer: >60 mL/min (ref >60)
Glucose, Bld: 100 mg/dL — ABNORMAL HIGH (ref 70–99)
Potassium: 4.5 mmol/L (ref 3.5–5.1)
Sodium: 141 mmol/L (ref 135–145)

## 2019-08-02 LAB — HEMOGLOBIN A1C
Hgb A1c MFr Bld: 5.4 % (ref 4.8–5.6)
Mean Plasma Glucose: 108.28 mg/dL

## 2019-08-02 NOTE — Progress Notes (Signed)
Anesthesia Chart Review   Case: [id number] Date/Time: 08/10/19 1300   Procedure: XI ROBOT ASSISTED LAPAROSCOPIC SIGMOID COLECTOMY W/ RIGID PROCTOSCOPY (N/A )   Anesthesia type: General   Pre-op diagnosis: Sigmoid Colon Cancer   Location: [facility] ROOM 02 / [facility]   Surgeons: Michael Boston, MD      DISCUSSION:79 y.o. never smoker with h/o severe AS, sigmoid colon cancer scheduled for above procedure 08/10/19 with Dr. Michael Boston.   Pt seen by cardiologist, Dr. Lauree Chandler, 07/02/19 to discuss aortic stenosis and possible TAVR.  Per OV note, "It would be optimal to proceed with resection of his mass before TAVR given the recent GI bleeding and the fact that he will need anti-platelet therapy post TAVR. He would also benefit from expediting the mass removal and planning for TAVR would delay this. Following TAVR, he would need at least one month of anti-platelet therapy. I will have him proceed with his planned colon mass removal. I have spoken to Dr. Lilia Pro and I think it would be best if this were done at [facility] with a cardiac anesthesia team on the case. The non-cardiac surgery can be performed at an acceptable risk in the gentleman with severe aortic stenosis. His aortic stenosis is not critical. The cardiac team will be available to assist as needed."  Discussed Dr. Camillia Herter note with Dr. Johney Maine' office.  This is a robotics case and cannot be done at [facility].    Discussed with Dr. Valma Cava and Dr. Smith Robert.  Anticipate pt can proceed with planned procedure barring acute status change and after evaluation DOS.   VS: BP (!) 141/96 (BP Location: Right Arm)   Pulse 82   Resp 18   Ht 5\' 8"  (1.727 m)   Wt 84.9 kg   SpO2 100%   BMI 28.44 kg/m   PROVIDERS: Mateo Flow, MD is PCP   Juliet Rude, MD is Cardiologist  LABS: Labs reviewed: Acceptable for surgery. (all labs ordered are listed, but only abnormal results are displayed)  Labs Reviewed  BASIC METABOLIC PANEL  CBC  HEMOGLOBIN  A1C     IMAGES: CT Chest 07/12/19 IMPRESSION: 1. There is a 6 mm pulmonary nodule of the inferior right upper lobe which abuts the minor fissure, very likely a benign fissural nodule or lymph node. No definite evidence of metastatic disease in the chest. Attention on follow-up. 2. Peripheral irregular interstitial opacity and ground-glass with a slight apical to basal gradient. No significant bronchiectasis or bronchiolectasis. Findings are consistent with mild pulmonary fibrosis in an "indeterminate for UIP pattern" by ATS pulmonary fibrosis criteria. Consider pulmonary referral and attention on follow-up for stability of pattern and fibrotic findings. Findings are indeterminate for UIP per consensus guidelines: Diagnosis of Idiopathic Pulmonary Fibrosis: An Official ATS/ERS/JRS/ALAT Clinical Practice Guideline. [place], Iss 5, [phone], Jan 04 2017. 3. Coronary artery disease. Aortic Atherosclerosis (ICD10-  EKG: 06/25/19 Rate 85 bpm  Sinus rhythm with frequent and consecutive premature complexes and fusion complexes Left axis deviation   CV: Echo 06/19/2019 Conclusions 1. Overall left ventricular systolic function is normal with, an EF between 55-60% 2. Left atrium is moderately dilated by volume.  3. Aortic valve is bicuspid and is severely thickened 4. There is doming of the aortic valve leaflets 5. There is severe aortic stenosis present 6. No mitral regurgitation.  MVA by PHT 1.21cm2 7. The aortic root size is normal.  The ascending aorta is not well visualized.   Aortic Valve:  Aortic valve is bicuspid an dis severely thickended.  There is doming of the aortic valve leaflets.  There is no evidence of aortic regurgitaiton.  There is severe aortic stenosis present.  Peak/mean gardient across the valve is 33mmHg/41mmHg, AVA 0.78cmm2, VR=0.32.  Past Medical History:  Diagnosis Date  . Aortic stenosis 07/02/2019   severe  . Carcinoma of colon  ([place]) dx'd 04/2019  . Glaucoma   . Heart murmur    Aortic stenosis  . Lumbosacral stenosis   . Restless leg     Past Surgical History:  Procedure Laterality Date  . BACK SURGERY  2013   Stenosis in the back at [place] 2001 and 2013  . HIP SURGERY Bilateral    2015 and 2016    MEDICATIONS: . gabapentin (NEURONTIN) 300 MG capsule  . latanoprost (XALATAN) 0.005 % ophthalmic solution   No current facility-administered medications for this encounter.    Maia Plan [facility] Pre-Surgical Testing [phone] 08/02/19  2:25 PM

## 2019-08-02 NOTE — Progress Notes (Signed)
PCP - Dr. Sarina Ser Cardiologist - Dr. Estevan Ryder  Chest x-ray - 07/13/19 EKG - 06/25/19 Stress Test - no ECHO - 06/22/19 Cardiac Cath - no. Pt will need a TAVR after this surgery for severe Aortic stenosis  Sleep Study - no CPAP -   Fasting Blood Sugar - NA Checks Blood Sugar _____ times a day  Blood Thinner Instructions:NA Aspirin Instructions: Last Dose:  Anesthesia review:   Patient denies shortness of breath, fever, cough and chest pain at PAT appointment yes  Patient verbalized understanding of instructions that were given to them at the PAT appointment. Patient was also instructed that they will need to review over the PAT instructions again at home before surgery. yes

## 2019-08-02 NOTE — Progress Notes (Signed)
I left a message with the Pt to let him know that they will move his surgery to [facility].

## 2019-08-03 NOTE — Anesthesia Preprocedure Evaluation (Addendum)
Anesthesia Evaluation  Patient identified by MRN, date of birth, ID band Patient awake    Reviewed: Allergy & Precautions, NPO status , Patient's Chart, lab work & pertinent test results  Airway Mallampati: II  TM Distance: >3 FB Neck ROM: Full    Dental no notable dental hx. (+) Teeth Intact   Pulmonary neg pulmonary ROS,    Pulmonary exam normal breath sounds clear to auscultation       Cardiovascular + Valvular Problems/Murmurs AS  Rhythm:Regular + Systolic murmurs Peak gradient AS 62mmHg AVA .78 cm2 Bicuspid Aortic valve   Neuro/Psych Restless legs syndrome Glaucoma negative neurological ROS  negative psych ROS   GI/Hepatic Neg liver ROS, Sigmoid Colon Ca   Endo/Other  negative endocrine ROS  Renal/GU negative Renal ROS  negative genitourinary   Musculoskeletal negative musculoskeletal ROS (+)   Abdominal   Peds  Hematology negative hematology ROS (+)   Anesthesia Other Findings   Reproductive/Obstetrics                             Anesthesia Physical Anesthesia Plan  ASA: IV  Anesthesia Plan: General   Post-op Pain Management:    Induction: Intravenous  PONV Risk Score and Plan: 4 or greater and Ondansetron, Dexamethasone and Treatment may vary due to age or medical condition  Airway Management Planned: Oral ETT  Additional Equipment: Arterial line  Intra-op Plan:   Post-operative Plan: Extubation in OR  Informed Consent: I have reviewed the patients History and Physical, chart, labs and discussed the procedure including the risks, benefits and alternatives for the proposed anesthesia with the patient or authorized representative who has indicated his/her understanding and acceptance.     Dental advisory given  Plan Discussed with: CRNA and Surgeon  Anesthesia Plan Comments: (See PAT note 08/02/2019, Konrad Felix, PA-C Phenylephrine gtt. Defib pads on patient.)        Anesthesia Quick Evaluation

## 2019-08-07 ENCOUNTER — Other Ambulatory Visit (HOSPITAL_COMMUNITY)
Admission: RE | Admit: 2019-08-07 | Discharge: 2019-08-07 | Disposition: A | Discharge status: Home / Self Care | Payer: Medicare PPO | Source: Ambulatory Visit | Attending: Surgery | Admitting: Surgery

## 2019-08-07 DIAGNOSIS — Z800 Family history of malignant neoplasm of digestive organs: Secondary | ICD-10-CM | POA: Diagnosis not present

## 2019-08-07 DIAGNOSIS — Z85820 Personal history of malignant melanoma of skin: Secondary | ICD-10-CM | POA: Diagnosis not present

## 2019-08-07 DIAGNOSIS — Z20822 Contact with and (suspected) exposure to covid-19: Secondary | ICD-10-CM | POA: Diagnosis present

## 2019-08-07 DIAGNOSIS — C772 Secondary and unspecified malignant neoplasm of intra-abdominal lymph nodes: Secondary | ICD-10-CM | POA: Diagnosis not present

## 2019-08-07 DIAGNOSIS — K660 Peritoneal adhesions (postprocedural) (postinfection): Secondary | ICD-10-CM | POA: Diagnosis present

## 2019-08-07 DIAGNOSIS — M4807 Spinal stenosis, lumbosacral region: Secondary | ICD-10-CM | POA: Diagnosis present

## 2019-08-07 DIAGNOSIS — G2581 Restless legs syndrome: Secondary | ICD-10-CM | POA: Diagnosis present

## 2019-08-07 DIAGNOSIS — Q438 Other specified congenital malformations of intestine: Secondary | ICD-10-CM | POA: Diagnosis not present

## 2019-08-07 DIAGNOSIS — Z79899 Other long term (current) drug therapy: Secondary | ICD-10-CM | POA: Diagnosis not present

## 2019-08-07 DIAGNOSIS — C187 Malignant neoplasm of sigmoid colon: Secondary | ICD-10-CM | POA: Diagnosis present

## 2019-08-07 DIAGNOSIS — H409 Unspecified glaucoma: Secondary | ICD-10-CM | POA: Diagnosis present

## 2019-08-07 DIAGNOSIS — K921 Melena: Secondary | ICD-10-CM | POA: Diagnosis not present

## 2019-08-07 DIAGNOSIS — I493 Ventricular premature depolarization: Secondary | ICD-10-CM | POA: Diagnosis present

## 2019-08-07 DIAGNOSIS — K5730 Diverticulosis of large intestine without perforation or abscess without bleeding: Secondary | ICD-10-CM | POA: Diagnosis not present

## 2019-08-07 DIAGNOSIS — I350 Nonrheumatic aortic (valve) stenosis: Secondary | ICD-10-CM | POA: Diagnosis present

## 2019-08-07 DIAGNOSIS — R200 Anesthesia of skin: Secondary | ICD-10-CM | POA: Diagnosis present

## 2019-08-07 DIAGNOSIS — C19 Malignant neoplasm of rectosigmoid junction: Secondary | ICD-10-CM | POA: Diagnosis present

## 2019-08-07 LAB — SARS CORONAVIRUS 2 (TAT 6-24 HRS): SARS Coronavirus 2: NEGATIVE

## 2019-08-09 MED ORDER — BUPIVACAINE LIPOSOME 1.3 % IJ SUSP
20.0000 mL | Freq: Once | INTRAMUSCULAR | Status: DC
  Filled 2019-08-09: qty 20

## 2019-08-10 ENCOUNTER — Inpatient Hospital Stay (HOSPITAL_COMMUNITY): Payer: Medicare PPO | Admitting: Physician Assistant

## 2019-08-10 ENCOUNTER — Other Ambulatory Visit: Payer: Self-pay

## 2019-08-10 ENCOUNTER — Inpatient Hospital Stay (HOSPITAL_COMMUNITY): Payer: Medicare PPO | Admitting: Certified Registered Nurse Anesthetist

## 2019-08-10 ENCOUNTER — Encounter (HOSPITAL_COMMUNITY): Payer: Self-pay | Admitting: Surgery

## 2019-08-10 ENCOUNTER — Inpatient Hospital Stay (HOSPITAL_COMMUNITY)
Admission: RE | Admit: 2019-08-10 | Discharge: 2019-08-12 | DRG: 330 | Disposition: A | Discharge status: Home / Self Care | Payer: Medicare PPO | Attending: Surgery | Admitting: Surgery

## 2019-08-10 ENCOUNTER — Encounter (HOSPITAL_COMMUNITY)
Admission: RE | Disposition: A | Discharge status: Home / Self Care | Payer: Self-pay | Source: Ambulatory Visit | Attending: Surgery

## 2019-08-10 DIAGNOSIS — G2581 Restless legs syndrome: Secondary | ICD-10-CM | POA: Diagnosis present

## 2019-08-10 DIAGNOSIS — M4807 Spinal stenosis, lumbosacral region: Secondary | ICD-10-CM | POA: Diagnosis present

## 2019-08-10 DIAGNOSIS — Z85820 Personal history of malignant melanoma of skin: Secondary | ICD-10-CM | POA: Diagnosis not present

## 2019-08-10 DIAGNOSIS — I493 Ventricular premature depolarization: Secondary | ICD-10-CM | POA: Diagnosis present

## 2019-08-10 DIAGNOSIS — Z800 Family history of malignant neoplasm of digestive organs: Secondary | ICD-10-CM

## 2019-08-10 DIAGNOSIS — K921 Melena: Secondary | ICD-10-CM | POA: Diagnosis not present

## 2019-08-10 DIAGNOSIS — R200 Anesthesia of skin: Secondary | ICD-10-CM

## 2019-08-10 DIAGNOSIS — C187 Malignant neoplasm of sigmoid colon: Principal | ICD-10-CM | POA: Diagnosis present

## 2019-08-10 DIAGNOSIS — Z20822 Contact with and (suspected) exposure to covid-19: Secondary | ICD-10-CM | POA: Diagnosis present

## 2019-08-10 DIAGNOSIS — I350 Nonrheumatic aortic (valve) stenosis: Secondary | ICD-10-CM

## 2019-08-10 DIAGNOSIS — C19 Malignant neoplasm of rectosigmoid junction: Secondary | ICD-10-CM | POA: Diagnosis present

## 2019-08-10 DIAGNOSIS — H409 Unspecified glaucoma: Secondary | ICD-10-CM | POA: Diagnosis present

## 2019-08-10 DIAGNOSIS — Z79899 Other long term (current) drug therapy: Secondary | ICD-10-CM

## 2019-08-10 DIAGNOSIS — Q438 Other specified congenital malformations of intestine: Secondary | ICD-10-CM

## 2019-08-10 DIAGNOSIS — K660 Peritoneal adhesions (postprocedural) (postinfection): Secondary | ICD-10-CM | POA: Diagnosis present

## 2019-08-10 SURGERY — COLECTOMY, PARTIAL, ROBOT-ASSISTED, LAPAROSCOPIC
Anesthesia: General | Site: Abdomen

## 2019-08-10 MED ORDER — EPHEDRINE SULFATE-NACL 50-0.9 MG/10ML-% IV SOSY
PREFILLED_SYRINGE | INTRAVENOUS | Status: DC | PRN
  Administered 2019-08-10: 5 mg via INTRAVENOUS

## 2019-08-10 MED ORDER — PHENYLEPHRINE 40 MCG/ML (10ML) SYRINGE FOR IV PUSH (FOR BLOOD PRESSURE SUPPORT)
PREFILLED_SYRINGE | INTRAVENOUS | Status: AC
  Filled 2019-08-10: qty 10

## 2019-08-10 MED ORDER — ROCURONIUM BROMIDE 50 MG/5ML IV SOSY
PREFILLED_SYRINGE | INTRAVENOUS | Status: DC | PRN
  Administered 2019-08-10: 60 mg via INTRAVENOUS
  Administered 2019-08-10: 10 mg via INTRAVENOUS
  Administered 2019-08-10: 20 mg via INTRAVENOUS

## 2019-08-10 MED ORDER — GABAPENTIN 100 MG PO CAPS
200.0000 mg | ORAL_CAPSULE | Freq: Three times a day (TID) | ORAL | Status: DC
  Administered 2019-08-10 – 2019-08-12 (×5): 200 mg via ORAL
  Filled 2019-08-10 (×5): qty 2

## 2019-08-10 MED ORDER — SODIUM CHLORIDE 0.9 % IV SOLN: 250.0000 mL | INTRAVENOUS | Status: DC | PRN

## 2019-08-10 MED ORDER — METRONIDAZOLE 500 MG PO TABS: 1000.0000 mg | ORAL_TABLET | ORAL | Status: DC

## 2019-08-10 MED ORDER — HYDROMORPHONE HCL 1 MG/ML IJ SOLN
0.2500 mg | INTRAMUSCULAR | Status: DC | PRN
  Administered 2019-08-10 (×2): 0.5 mg via INTRAVENOUS

## 2019-08-10 MED ORDER — ENALAPRILAT 1.25 MG/ML IV SOLN
0.6250 mg | Freq: Four times a day (QID) | INTRAVENOUS | Status: DC | PRN
  Filled 2019-08-10: qty 1

## 2019-08-10 MED ORDER — TRAMADOL HCL 50 MG PO TABS: 50.0000 mg | ORAL_TABLET | Freq: Four times a day (QID) | ORAL | Status: DC | PRN

## 2019-08-10 MED ORDER — POLYETHYLENE GLYCOL 3350 17 GM/SCOOP PO POWD: 1.0000 | Freq: Once | ORAL | Status: DC

## 2019-08-10 MED ORDER — ONDANSETRON HCL 4 MG PO TABS: 4.0000 mg | ORAL_TABLET | Freq: Four times a day (QID) | ORAL | Status: DC | PRN

## 2019-08-10 MED ORDER — BUPIVACAINE HCL (PF) 0.25 % IJ SOLN
INTRAMUSCULAR | Status: DC | PRN
  Administered 2019-08-10: 60 mL

## 2019-08-10 MED ORDER — ONDANSETRON HCL 4 MG/2ML IJ SOLN: 4.0000 mg | Freq: Once | INTRAMUSCULAR | Status: DC | PRN

## 2019-08-10 MED ORDER — DIPHENHYDRAMINE HCL 50 MG/ML IJ SOLN: 12.5000 mg | Freq: Four times a day (QID) | INTRAMUSCULAR | Status: DC | PRN

## 2019-08-10 MED ORDER — ONDANSETRON HCL 4 MG/2ML IJ SOLN: 4.0000 mg | Freq: Four times a day (QID) | INTRAMUSCULAR | Status: DC | PRN

## 2019-08-10 MED ORDER — LACTATED RINGERS IV SOLN: INTRAVENOUS | Status: DC | PRN

## 2019-08-10 MED ORDER — SODIUM CHLORIDE 0.9 % IV SOLN: Freq: Three times a day (TID) | INTRAVENOUS | Status: DC | PRN

## 2019-08-10 MED ORDER — GABAPENTIN 300 MG PO CAPS
300.0000 mg | ORAL_CAPSULE | ORAL | Status: AC
  Administered 2019-08-10: 300 mg via ORAL
  Filled 2019-08-10: qty 1

## 2019-08-10 MED ORDER — PROCHLORPERAZINE MALEATE 10 MG PO TABS
10.0000 mg | ORAL_TABLET | Freq: Four times a day (QID) | ORAL | Status: DC | PRN
  Filled 2019-08-10: qty 1

## 2019-08-10 MED ORDER — LIDOCAINE 2% (20 MG/ML) 5 ML SYRINGE
INTRAMUSCULAR | Status: AC
  Filled 2019-08-10: qty 5

## 2019-08-10 MED ORDER — ALVIMOPAN 12 MG PO CAPS
12.0000 mg | ORAL_CAPSULE | ORAL | Status: AC
  Administered 2019-08-10: 12 mg via ORAL
  Filled 2019-08-10: qty 1

## 2019-08-10 MED ORDER — SODIUM CHLORIDE 0.9 % IV SOLN
2.0000 g | INTRAVENOUS | Status: AC
  Administered 2019-08-10: 2 g via INTRAVENOUS
  Filled 2019-08-10: qty 2

## 2019-08-10 MED ORDER — SODIUM CHLORIDE 0.9 % IV SOLN
2.0000 g | Freq: Two times a day (BID) | INTRAVENOUS | Status: AC
  Administered 2019-08-11: 2 g via INTRAVENOUS
  Filled 2019-08-10: qty 2

## 2019-08-10 MED ORDER — BUPIVACAINE HCL (PF) 0.25 % IJ SOLN
INTRAMUSCULAR | Status: AC
  Filled 2019-08-10: qty 60

## 2019-08-10 MED ORDER — ENSURE SURGERY PO LIQD
237.0000 mL | Freq: Two times a day (BID) | ORAL | Status: DC
  Administered 2019-08-11 – 2019-08-12 (×2): 237 mL via ORAL
  Filled 2019-08-10 (×4): qty 237

## 2019-08-10 MED ORDER — ALUM & MAG HYDROXIDE-SIMETH 200-200-20 MG/5ML PO SUSP: 30.0000 mL | Freq: Four times a day (QID) | ORAL | Status: DC | PRN

## 2019-08-10 MED ORDER — ACETAMINOPHEN 500 MG PO TABS
1000.0000 mg | ORAL_TABLET | Freq: Four times a day (QID) | ORAL | Status: DC
  Administered 2019-08-10 – 2019-08-12 (×8): 1000 mg via ORAL
  Filled 2019-08-10 (×8): qty 2

## 2019-08-10 MED ORDER — BUPIVACAINE LIPOSOME 1.3 % IJ SUSP
INTRAMUSCULAR | Status: DC | PRN
  Administered 2019-08-10: 20 mL

## 2019-08-10 MED ORDER — HYDROMORPHONE HCL 1 MG/ML IJ SOLN
INTRAMUSCULAR | Status: AC
  Filled 2019-08-10: qty 1

## 2019-08-10 MED ORDER — LACTATED RINGERS IV SOLN: INTRAVENOUS | Status: DC

## 2019-08-10 MED ORDER — GABAPENTIN 300 MG PO CAPS: 300.0000 mg | ORAL_CAPSULE | Freq: Every day | ORAL | Status: DC

## 2019-08-10 MED ORDER — ENOXAPARIN SODIUM 40 MG/0.4ML ~~LOC~~ SOLN
40.0000 mg | Freq: Once | SUBCUTANEOUS | Status: AC
  Administered 2019-08-10: 40 mg via SUBCUTANEOUS
  Filled 2019-08-10: qty 0.4

## 2019-08-10 MED ORDER — FENTANYL CITRATE (PF) 250 MCG/5ML IJ SOLN
INTRAMUSCULAR | Status: AC
  Filled 2019-08-10: qty 5

## 2019-08-10 MED ORDER — ONDANSETRON HCL 4 MG/2ML IJ SOLN
INTRAMUSCULAR | Status: AC
  Filled 2019-08-10: qty 2

## 2019-08-10 MED ORDER — HYDROMORPHONE HCL 1 MG/ML IJ SOLN: 0.5000 mg | INTRAMUSCULAR | Status: DC | PRN

## 2019-08-10 MED ORDER — SODIUM CHLORIDE 0.9% FLUSH
3.0000 mL | INTRAVENOUS | Status: DC | PRN
  Administered 2019-08-10: 3 mL via INTRAVENOUS

## 2019-08-10 MED ORDER — LIP MEDEX EX OINT
1.0000 "application " | TOPICAL_OINTMENT | Freq: Two times a day (BID) | CUTANEOUS | Status: DC
  Administered 2019-08-10 – 2019-08-12 (×4): 1 via TOPICAL
  Filled 2019-08-10 (×2): qty 7

## 2019-08-10 MED ORDER — LATANOPROST 0.005 % OP SOLN
1.0000 [drp] | Freq: Every day | OPHTHALMIC | Status: DC
  Administered 2019-08-10 – 2019-08-11 (×2): 1 [drp] via OPHTHALMIC
  Filled 2019-08-10: qty 2.5

## 2019-08-10 MED ORDER — METOPROLOL TARTRATE 5 MG/5ML IV SOLN: 5.0000 mg | Freq: Four times a day (QID) | INTRAVENOUS | Status: DC | PRN

## 2019-08-10 MED ORDER — INDOCYANINE GREEN 25 MG IV SOLR
INTRAVENOUS | Status: DC | PRN
  Administered 2019-08-10: 7.5 mg via INTRAVENOUS

## 2019-08-10 MED ORDER — MAGIC MOUTHWASH
15.0000 mL | Freq: Four times a day (QID) | ORAL | Status: DC | PRN
  Filled 2019-08-10: qty 15

## 2019-08-10 MED ORDER — NEOMYCIN SULFATE 500 MG PO TABS: 1000.0000 mg | ORAL_TABLET | ORAL | Status: DC

## 2019-08-10 MED ORDER — ALVIMOPAN 12 MG PO CAPS
12.0000 mg | ORAL_CAPSULE | Freq: Two times a day (BID) | ORAL | Status: DC
  Filled 2019-08-10: qty 1

## 2019-08-10 MED ORDER — DEXAMETHASONE SODIUM PHOSPHATE 10 MG/ML IJ SOLN
INTRAMUSCULAR | Status: DC | PRN
  Administered 2019-08-10: 7 mg via INTRAVENOUS

## 2019-08-10 MED ORDER — PROPOFOL 10 MG/ML IV BOLUS
INTRAVENOUS | Status: DC | PRN
  Administered 2019-08-10: 40 mg via INTRAVENOUS
  Administered 2019-08-10: 80 mg via INTRAVENOUS

## 2019-08-10 MED ORDER — FENTANYL CITRATE (PF) 100 MCG/2ML IJ SOLN
INTRAMUSCULAR | Status: DC | PRN
  Administered 2019-08-10 (×3): 50 ug via INTRAVENOUS
  Administered 2019-08-10: 150 ug via INTRAVENOUS

## 2019-08-10 MED ORDER — BISACODYL 5 MG PO TBEC: 20.0000 mg | DELAYED_RELEASE_TABLET | Freq: Once | ORAL | Status: DC

## 2019-08-10 MED ORDER — LACTATED RINGERS IR SOLN
Status: DC | PRN
  Administered 2019-08-10: 1000 mL

## 2019-08-10 MED ORDER — PROCHLORPERAZINE EDISYLATE 10 MG/2ML IJ SOLN: 5.0000 mg | Freq: Four times a day (QID) | INTRAMUSCULAR | Status: DC | PRN

## 2019-08-10 MED ORDER — DIPHENHYDRAMINE HCL 12.5 MG/5ML PO ELIX: 12.5000 mg | ORAL_SOLUTION | Freq: Four times a day (QID) | ORAL | Status: DC | PRN

## 2019-08-10 MED ORDER — PROPOFOL 10 MG/ML IV BOLUS
INTRAVENOUS | Status: AC
  Filled 2019-08-10: qty 20

## 2019-08-10 MED ORDER — FENTANYL CITRATE (PF) 100 MCG/2ML IJ SOLN
INTRAMUSCULAR | Status: AC
  Filled 2019-08-10: qty 2

## 2019-08-10 MED ORDER — ACETAMINOPHEN 500 MG PO TABS
1000.0000 mg | ORAL_TABLET | ORAL | Status: AC
  Administered 2019-08-10: 1000 mg via ORAL
  Filled 2019-08-10: qty 2

## 2019-08-10 MED ORDER — EPHEDRINE 5 MG/ML INJ
INTRAVENOUS | Status: AC
  Filled 2019-08-10: qty 10

## 2019-08-10 MED ORDER — ONDANSETRON HCL 4 MG/2ML IJ SOLN
INTRAMUSCULAR | Status: DC | PRN
  Administered 2019-08-10: 4 mg via INTRAVENOUS

## 2019-08-10 MED ORDER — LIDOCAINE 2% (20 MG/ML) 5 ML SYRINGE
INTRAMUSCULAR | Status: DC | PRN
  Administered 2019-08-10: 60 mg via INTRAVENOUS

## 2019-08-10 MED ORDER — ENOXAPARIN SODIUM 40 MG/0.4ML ~~LOC~~ SOLN
40.0000 mg | SUBCUTANEOUS | Status: DC
  Administered 2019-08-11 – 2019-08-12 (×2): 40 mg via SUBCUTANEOUS
  Filled 2019-08-10 (×2): qty 0.4

## 2019-08-10 MED ORDER — SODIUM CHLORIDE 0.9% FLUSH
3.0000 mL | Freq: Two times a day (BID) | INTRAVENOUS | Status: DC
  Administered 2019-08-10 – 2019-08-11 (×2): 3 mL via INTRAVENOUS

## 2019-08-10 MED ORDER — ROCURONIUM BROMIDE 10 MG/ML (PF) SYRINGE
PREFILLED_SYRINGE | INTRAVENOUS | Status: AC
  Filled 2019-08-10: qty 10

## 2019-08-10 MED ORDER — 0.9 % SODIUM CHLORIDE (POUR BTL) OPTIME
TOPICAL | Status: DC | PRN
  Administered 2019-08-10: 2000 mL

## 2019-08-10 MED ORDER — PHENYLEPHRINE HCL-NACL 10-0.9 MG/250ML-% IV SOLN
INTRAVENOUS | Status: DC | PRN
  Administered 2019-08-10: 50 ug/min via INTRAVENOUS

## 2019-08-10 MED ORDER — SUGAMMADEX SODIUM 200 MG/2ML IV SOLN
INTRAVENOUS | Status: DC | PRN
  Administered 2019-08-10: 200 mg via INTRAVENOUS

## 2019-08-10 MED ORDER — PHENYLEPHRINE 40 MCG/ML (10ML) SYRINGE FOR IV PUSH (FOR BLOOD PRESSURE SUPPORT)
PREFILLED_SYRINGE | INTRAVENOUS | Status: DC | PRN
  Administered 2019-08-10 (×2): 80 ug via INTRAVENOUS

## 2019-08-10 SURGICAL SUPPLY — 106 items
APPLIER CLIP 5 13 M/L LIGAMAX5 (MISCELLANEOUS)
APPLIER CLIP ROT 10 11.4 M/L (STAPLE)
BLADE EXTENDED COATED 6.5IN (ELECTRODE) IMPLANT
CANNULA REDUC XI 12-8 STAPL (CANNULA)
CANNULA REDUC XI 12-8MM STAPL (CANNULA)
CANNULA REDUCER 12-8 DVNC XI (CANNULA) IMPLANT
CELLS DAT CNTRL 66122 CELL SVR (MISCELLANEOUS) IMPLANT
CLIP APPLIE 5 13 M/L LIGAMAX5 (MISCELLANEOUS) IMPLANT
CLIP APPLIE ROT 10 11.4 M/L (STAPLE) IMPLANT
COVER SURGICAL LIGHT HANDLE (MISCELLANEOUS) ×6 IMPLANT
COVER TIP SHEARS 8 DVNC (MISCELLANEOUS) ×1 IMPLANT
COVER TIP SHEARS 8MM DA VINCI (MISCELLANEOUS) ×2
COVER WAND RF STERILE (DRAPES) ×1 IMPLANT
DECANTER SPIKE VIAL GLASS SM (MISCELLANEOUS) ×5 IMPLANT
DEVICE TROCAR PUNCTURE CLOSURE (ENDOMECHANICALS) IMPLANT
DRAIN CHANNEL 19F RND (DRAIN) IMPLANT
DRAPE ARM DVNC X/XI (DISPOSABLE) ×4 IMPLANT
DRAPE CARDIOVASC SPLIT 84X147 (DRAPES) ×2 IMPLANT
DRAPE CARDIOVASC SPLIT 88X140 (DRAPES) ×2 IMPLANT
DRAPE COLUMN DVNC XI (DISPOSABLE) ×1 IMPLANT
DRAPE DA VINCI XI ARM (DISPOSABLE) ×8
DRAPE DA VINCI XI COLUMN (DISPOSABLE) ×2
DRAPE SURG IRRIG POUCH 19X23 (DRAPES) ×3 IMPLANT
DRSG OPSITE POSTOP 4X10 (GAUZE/BANDAGES/DRESSINGS) IMPLANT
DRSG OPSITE POSTOP 4X6 (GAUZE/BANDAGES/DRESSINGS) ×2 IMPLANT
DRSG OPSITE POSTOP 4X8 (GAUZE/BANDAGES/DRESSINGS) IMPLANT
DRSG TEGADERM 2-3/8X2-3/4 SM (GAUZE/BANDAGES/DRESSINGS) ×15 IMPLANT
DRSG TEGADERM 4X4.75 (GAUZE/BANDAGES/DRESSINGS) IMPLANT
ELECT PENCIL ROCKER SW 15FT (MISCELLANEOUS) ×3 IMPLANT
ELECT REM PT RETURN 15FT ADLT (MISCELLANEOUS) ×3 IMPLANT
ENDOLOOP SUT PDS II  0 18 (SUTURE)
ENDOLOOP SUT PDS II 0 18 (SUTURE) IMPLANT
EVACUATOR SILICONE 100CC (DRAIN) IMPLANT
GAUZE SPONGE 2X2 8PLY STRL LF (GAUZE/BANDAGES/DRESSINGS) ×1 IMPLANT
GLOVE ECLIPSE 8.0 STRL XLNG CF (GLOVE) ×9 IMPLANT
GLOVE INDICATOR 8.0 STRL GRN (GLOVE) ×9 IMPLANT
GOWN STRL REUS W/TWL XL LVL3 (GOWN DISPOSABLE) ×9 IMPLANT
GRASPER SUT TROCAR 14GX15 (MISCELLANEOUS) IMPLANT
HOLDER FOLEY CATH W/STRAP (MISCELLANEOUS) ×3 IMPLANT
IRRIG SUCT STRYKERFLOW 2 WTIP (MISCELLANEOUS) ×3
IRRIGATION SUCT STRKRFLW 2 WTP (MISCELLANEOUS) ×1 IMPLANT
KIT PROCEDURE DA VINCI SI (MISCELLANEOUS) ×2
KIT PROCEDURE DVNC SI (MISCELLANEOUS) IMPLANT
KIT SIGMOIDOSCOPE (SET/KITS/TRAYS/PACK) ×2 IMPLANT
KIT TURNOVER KIT A (KITS) IMPLANT
NDL INSUFFLATION 14GA 120MM (NEEDLE) ×1 IMPLANT
NEEDLE INSUFFLATION 14GA 120MM (NEEDLE) ×3 IMPLANT
PACK COLON (CUSTOM PROCEDURE TRAY) ×3 IMPLANT
PAD POSITIONING PINK XL (MISCELLANEOUS) ×3 IMPLANT
PORT LAP GEL ALEXIS MED 5-9CM (MISCELLANEOUS) ×3 IMPLANT
PROTECTOR NERVE ULNAR (MISCELLANEOUS) ×6 IMPLANT
RELOAD STAPLE 45 BLU REG DVNC (STAPLE) IMPLANT
RELOAD STAPLE 45 GRN THCK DVNC (STAPLE) IMPLANT
RELOAD STAPLE 60 4.3 GRN DVNC (STAPLE) IMPLANT
RELOAD STAPLER 4.3X60 GRN DVNC (STAPLE) ×1 IMPLANT
RETRACTOR WND ALEXIS 18 MED (MISCELLANEOUS) IMPLANT
RTRCTR WOUND ALEXIS 18CM MED (MISCELLANEOUS)
SCISSORS LAP 5X35 DISP (ENDOMECHANICALS) ×3 IMPLANT
SEAL CANN UNIV 5-8 DVNC XI (MISCELLANEOUS) ×3 IMPLANT
SEAL XI 5MM-8MM UNIVERSAL (MISCELLANEOUS) ×8
SEALER VESSEL DA VINCI XI (MISCELLANEOUS) ×2
SEALER VESSEL EXT DVNC XI (MISCELLANEOUS) ×1 IMPLANT
SOLUTION ELECTROLUBE (MISCELLANEOUS) ×3 IMPLANT
SPONGE GAUZE 2X2 STER 10/PKG (GAUZE/BANDAGES/DRESSINGS) ×2
SPONGE LAP 18X18 RF (DISPOSABLE) ×2 IMPLANT
STAPLER 45 BLU RELOAD XI (STAPLE) IMPLANT
STAPLER 45 BLUE RELOAD XI (STAPLE)
STAPLER 45 DA VINCI SURE FORM (STAPLE)
STAPLER 45 GREEN RELOAD XI (STAPLE)
STAPLER 45 GRN RELOAD XI (STAPLE) IMPLANT
STAPLER 45 SUREFORM DVNC (STAPLE) IMPLANT
STAPLER 60 DA VINCI SURE FORM (STAPLE) ×2
STAPLER 60 SUREFORM DVNC (STAPLE) IMPLANT
STAPLER CANNULA SEAL DVNC XI (STAPLE) ×1 IMPLANT
STAPLER CANNULA SEAL XI (STAPLE) ×2
STAPLER ECHELON POWER CIR 31 (STAPLE) ×2 IMPLANT
STAPLER RELOAD 4.3X60 GREEN (STAPLE) ×2
STAPLER RELOAD 4.3X60 GRN DVNC (STAPLE) ×1
STOPCOCK 4 WAY LG BORE MALE ST (IV SETS) ×6 IMPLANT
SURGILUBE 2OZ TUBE FLIPTOP (MISCELLANEOUS) ×2 IMPLANT
SUT MNCRL AB 4-0 PS2 18 (SUTURE) ×3 IMPLANT
SUT PDS AB 1 CT1 27 (SUTURE) ×6 IMPLANT
SUT PROLENE 0 CT 2 (SUTURE) IMPLANT
SUT PROLENE 2 0 KS (SUTURE) ×2 IMPLANT
SUT PROLENE 2 0 SH DA (SUTURE) IMPLANT
SUT SILK 2 0 (SUTURE)
SUT SILK 2 0 SH CR/8 (SUTURE) ×2 IMPLANT
SUT SILK 2-0 18XBRD TIE 12 (SUTURE) IMPLANT
SUT SILK 3 0 (SUTURE)
SUT SILK 3 0 SH CR/8 (SUTURE) ×3 IMPLANT
SUT SILK 3-0 18XBRD TIE 12 (SUTURE) IMPLANT
SUT V-LOC BARB 180 2/0GR6 GS22 (SUTURE)
SUT VIC AB 3-0 SH 18 (SUTURE) IMPLANT
SUT VIC AB 3-0 SH 27 (SUTURE)
SUT VIC AB 3-0 SH 27XBRD (SUTURE) IMPLANT
SUT VICRYL 0 UR6 27IN ABS (SUTURE) ×3 IMPLANT
SUTURE V-LC BRB 180 2/0GR6GS22 (SUTURE) IMPLANT
SYR 10ML ECCENTRIC (SYRINGE) ×3 IMPLANT
SYS LAPSCP GELPORT 120MM (MISCELLANEOUS)
SYSTEM LAPSCP GELPORT 120MM (MISCELLANEOUS) IMPLANT
TOWEL OR NON WOVEN STRL DISP B (DISPOSABLE) ×3 IMPLANT
TRAY FOLEY MTR SLVR 16FR STAT (SET/KITS/TRAYS/PACK) ×3 IMPLANT
TROCAR ADV FIXATION 5X100MM (TROCAR) ×3 IMPLANT
TUBING CONNECTING 10 (TUBING) ×4 IMPLANT
TUBING CONNECTING 10' (TUBING) ×2
TUBING INSUFFLATION 10FT LAP (TUBING) ×3 IMPLANT

## 2019-08-10 NOTE — Interval H&P Note (Signed)
History and Physical Interval Note:  08/10/2019 12:58 PM  Richard Washington  has presented today for surgery, with the diagnosis of Sigmoid Colon Cancer.  The various methods of treatment have been discussed with the patient and family. After consideration of risks, benefits and other options for treatment, the patient has consented to  Procedure(s): XI ROBOT ASSISTED LAPAROSCOPIC SIGMOID COLECTOMY W/ RIGID PROCTOSCOPY (N/A) as a surgical intervention.  The patient's history has been reviewed, patient examined, no change in status, stable for surgery.  I have reviewed the patient's chart and labs.  Questions were answered to the patient's satisfaction.     [name]  I have re-reviewed the the patient's records, history, medications, and allergies.  I have re-examined the patient.  I again discussed intraoperative plans and goals of post-operative recovery.  The patient agrees to proceed.  Richard Washington  Richard Washington, Richard Washington  Patient Care Team: Mateo Flow, MD as PCP - General (Family Medicine) Berniece Salines, DO as PCP - Cardiology (Cardiology) Michael Boston, MD as Consulting Physician (Colon and Rectal Surgery) Burnell Blanks, MD as Consulting Physician (Cardiology) Pollyann Samples, MD as Consulting Physician (General Surgery)  Patient Active Problem List   Diagnosis Date Noted   Cancer of sigmoid colon ([place]) 07/02/2019   Severe calcific aortic stenosis 06/26/2019   PVC's (premature ventricular contractions) 06/26/2019    Past Medical History:  Diagnosis Date   Aortic stenosis 07/02/2019   severe   Carcinoma of colon ([place]) dx'd 04/2019   Glaucoma    Heart murmur    Aortic stenosis   Lumbosacral stenosis    Restless leg     Past Surgical History:  Procedure Laterality Date   BACK SURGERY  2013   Stenosis in the back at [place]     X2 2001 and 2013   HIP SURGERY Bilateral    2015 and 2016    Social History   Socioeconomic History    Marital status: Married    Spouse name: Not on file   Number of children: 3   Years of education: Not on file   Highest education level: Not on file  Occupational History   Occupation: Retired-Worked for [employer] Use   Smoking status: Never Smoker   Smokeless tobacco: Former Systems developer    Types: Chew  Substance and Sexual Activity   Alcohol use: Never   Drug use: Never   Sexual activity: Not on file  Other Topics Concern   Not on file  Social History Narrative   Not on file   Social Determinants of Health   Financial Resource Strain:    Difficulty of Paying Living Expenses:   Food Insecurity:    Worried About Charity fundraiser in the Last Year:    Arboriculturist in the Last Year:   Transportation Needs:    Film/video editor (Medical):    Lack of Transportation (Non-Medical):   Physical Activity:    Days of Exercise per Week:    Minutes of Exercise per Session:   Stress:    Feeling of Stress :   Social Connections:    Frequency of Communication with Friends and Family:    Frequency of Social Gatherings with Friends and Family:    Attends Religious Services:    Active Member of Clubs or Organizations:    Attends Archivist Meetings:    Marital Status:   Intimate Partner Violence:    Fear  of Current or Ex-Partner:    Emotionally Abused:    Physically Abused:    Sexually Abused:     Family History  Problem Relation Age of Onset   Arrhythmia Mother    Arthritis Mother    Heart attack Father    Colon cancer Father    Arthritis Father    Multiple sclerosis Sister     Medications Prior to Admission  Medication Sig Dispense Refill Last Dose   gabapentin (NEURONTIN) 300 MG capsule Take 300 mg by mouth at bedtime.   08/09/2019 at Unknown time   latanoprost (XALATAN) 0.005 % ophthalmic solution Place 1 drop into both eyes at bedtime.    08/09/2019 at Unknown time    Current Facility-Administered Medications  Medication Dose Route  Frequency Provider Last Rate Last Admin   bupivacaine liposome (EXPAREL) 1.3 % injection 266 mg  20 mL Infiltration Once Theodis Shove M, RPH       cefoTEtan (CEFOTAN) 2 g in sodium chloride 0.9 % 100 mL IVPB  2 g Intravenous On Call to OR Michael Boston, MD       lactated ringers infusion   Intravenous Continuous Myrtie Soman, MD 50 mL/hr at 08/10/19 1129 New Bag at 08/10/19 1129     No Known Allergies  BP 131/83   Pulse 78   Temp 98.7 F (37.1 C) (Oral)   Resp 17   Ht 5\' 8"  (1.727 m)   Wt 84.9 kg   SpO2 99%   BMI Washington.46 kg/m   Labs: No results found for this or any previous visit (from the past 48 hour(s)).  Imaging / Studies: CT CHEST W CONTRAST  Result Date: 07/13/2019 CLINICAL DATA:  Colon cancer staging EXAM: CT CHEST WITH CONTRAST TECHNIQUE: Multidetector CT imaging of the chest was performed during intravenous contrast administration. CONTRAST:  16mL OMNIPAQUE IOHEXOL 300 MG/ML  SOLN COMPARISON:  CT abdomen pelvis, 06/17/2019 FINDINGS: Cardiovascular: Aortic atherosclerosis. Normal heart size. Three-vessel coronary artery calcifications. No pericardial effusion. Mediastinum/Nodes: No enlarged mediastinal, hilar, or axillary lymph nodes. Thyroid gland, trachea, and esophagus demonstrate no significant findings. Lungs/Pleura: There is peripheral irregular interstitial opacity and ground-glass with a slight apical to basal gradient. No significant bronchiectasis the or bronchiolectasis. There is a 6 mm pulmonary nodule of the inferior right upper lobe which abuts the minor fissure, very likely a benign fissural nodule or lymph node (series 7, image 66). No pleural effusion or pneumothorax. Upper Abdomen: No acute abnormality. Musculoskeletal: No chest wall mass or suspicious bone lesions identified. Severe bilateral glenohumeral arthrosis. Disc degenerative disease of the thoracic spine with exaggerated kyphosis. IMPRESSION: 1. There is a 6 mm pulmonary nodule of the inferior right upper  lobe which abuts the minor fissure, very likely a benign fissural nodule or lymph node. No definite evidence of metastatic disease in the chest. Attention on follow-up. 2. Peripheral irregular interstitial opacity and ground-glass with a slight apical to basal gradient. No significant bronchiectasis or bronchiolectasis. Findings are consistent with mild pulmonary fibrosis in an "indeterminate for UIP pattern" by ATS pulmonary fibrosis criteria. Consider pulmonary referral and attention on follow-up for stability of pattern and fibrotic findings. Findings are indeterminate for UIP per consensus guidelines: Diagnosis of Idiopathic Pulmonary Fibrosis: An Official ATS/ERS/JRS/ALAT Clinical Practice Guideline. [place], Iss 5, [id number], Jan 04 2017. 3. Coronary artery disease. Aortic Atherosclerosis (ICD10-I70.0). Electronically Signed   By: Eddie Candle M.D.   On: 07/13/2019 08:58     .[name],  M.D., F.A.C.S. Gastrointestinal and Minimally Invasive Surgery [facility] [address] [place], [place] [address] [phone] Main / Paging  08/10/2019 12:59 PM

## 2019-08-10 NOTE — Op Note (Signed)
08/10/2019  4:18 PM  PATIENT:  Richard Washington  79 y.o. male  Patient Care Team: Mateo Flow, MD as PCP - General (Family Medicine) Berniece Salines, DO as PCP - Cardiology (Cardiology) Michael Boston, MD as Consulting Physician (Colon and Rectal Surgery) Burnell Blanks, MD as Consulting Physician (Cardiology) Pollyann Samples, MD as Consulting Physician (General Surgery)  PRE-OPERATIVE DIAGNOSIS:  Sigmoid Colon Cancer  POST-OPERATIVE DIAGNOSIS:  Sigmoid Colon Cancer  PROCEDURE: XI ROBOTIC SIGMOID COLECTOMY RIGID PROCTOSCOPY TAP BLOCK - BILATERAL  SURGEON:  [name], MD  ASSISTANT: Leighton Ruff, MD, FACS. An experienced assistant was required given the standard of surgical care given the complexity of the case.  This assistant was needed for exposure, dissection, suctioning, retraction, instrument exchange, etc.  ANESTHESIA:     General  Nerve block provided with liposomal bupivacaine (Experel) mixed with 0.25% bupivacaine as a Bilateral TAP block x 26mL each side at the level of the transverse abdominis & preperitoneal spaces along the flank at the anterior axillary line, from subcostal ridge to iliac crest under laparoscopic guidance   Local field block at port sites & extraction wound  EBL:  Total I/O In: 1700 [I.V.:1600; IV Piggyback:100] Out: 225 [Urine:175; Blood:50]  Delay start of Pharmacological VTE agent (>24hrs) due to surgical blood loss or risk of bleeding:  no  DRAINS: No  SPECIMEN:   RECTOSIGMOID COLON (open end proximal) DISTAL ANASTOMOTIC RING (final distal margin)  DISPOSITION OF SPECIMEN:  PATHOLOGY  COUNTS:  YES  PLAN OF CARE: Admit to inpatient   PATIENT DISPOSITION:  PACU - hemodynamically stable.  INDICATION:    Elderly gentleman with hematochezia.  Underwent endoscopy and found to have sigmoid colon cancer.  Heart murmur heard on physical exam.  Found to have severe aortic stenosis.  Seen by cardiology.  Patient with good  performance status.  It was felt that the colon cancer was the more pressing issue given it being bleeding.  Recommendation made for colectomy first and then evaluation for possible valve replacement postoperatively.  I recommended segmental resection:  The anatomy & physiology of the digestive tract was discussed.  The pathophysiology was discussed.  Natural history risks without surgery was discussed.   I worked to give an overview of the disease and the frequent need to have multispecialty involvement.  I feel the risks of no intervention will lead to serious problems that outweigh the operative risks; therefore, I recommended a partial colectomy to remove the pathology.  Laparoscopic & open techniques were discussed.   Risks such as bleeding, infection, abscess, leak, reoperation, possible ostomy, hernia, heart attack, death, and other risks were discussed.  I noted a good likelihood this will help address the problem.   Goals of post-operative recovery were discussed as well.  We will work to minimize complications.  Educational materials on the pathology had been given in the office.  Questions were answered.    The patient expressed understanding & wished to proceed with surgery.  OR FINDINGS:   Patient had torturous sigmoid colon.  Bulky sigmoid cancer at site of tattooing.  Rather thick but not completely obstructing.  No obvious metastatic disease on visceral parietal peritoneum or liver.  The anastomosis rests 14 cm from the anal verge by rigid proctoscopy.  CASE DATA:  Type of patient?: Elective [facility] Private Case  Status of Case? Elective Scheduled  Infection Present At Time Of Surgery (PATOS)?  NO  DESCRIPTION:   Informed consent was confirmed.  The patient underwent general  anaesthesia without difficulty.  The patient was positioned appropriately.  VTE prevention in place.  The patient was clipped, prepped, & draped in a sterile fashion.  Surgical timeout confirmed our  plan.  The patient was positioned in reverse Trendelenburg.  Abdominal entry was gained using Varess technique at the left subcostal ridge on the anterior abdominal wall.  No elevated EtCO2 noted.  Port placed.  Camera inspection revealed no injury.  Extra ports were carefully placed under direct laparoscopic visualization.  Omental adhesions were noted in the left upper quadrant.  They were left in place.  No other abdominal adhesions.  Could see redundant sigmoid colon.  Rather narrow pedicle but no evidence of any volvulus or torsion.  Tattooing confirming a bulky mid sigmoid tumor.  I reflected the greater omentum and the upper abdomen the small bowel in the upper abdomen.  The patient was carefully positioned.  The Intuitive daVinci robot was docked with camera & instruments carefully placed.   I scored the base of peritoneum of the medial side of the mesentery of the elevated left colon from the ligament of Treitz to the peritoneal reflection of the mid rectum.   I elevated the sigmoid mesentery and entered into the retro-mesenteric plane. We were able to identify the left ureter and gonadal vessels. We kept those posterior within the retroperitoneum and elevated the left colon mesentery off that. I did isolate the inferior mesenteric artery (IMA) pedicle but did not ligate it yet.  I continued distally and got into the avascular plane posterior to the mesorectum. This allowed me to help mobilize the rectum as well by freeing the mesorectum off the sacrum.  I mobilized the peritoneal coverings towards the peritoneal reflection on both the right and left sides of the rectum.  I stayed away from the right and left ureters.  I kept the lateral vascular pedicles to the rectum intact.  I skeletonized the lymph nodes off the inferior mesenteric artery pedicle.  I went down to its takeoff from the aorta.   I isolated the inferior mesenteric vein off of the ligament of Treitz just cephalad to that as well.   After confirming the left ureter was out of the way, I went ahead and ligated the inferior mesenteric artery pedicle just near its takeoff from the aorta.  I did ligate the inferior mesenteric vein in a similar fashion.  We ensured hemostasis. I skeletonized the mesorectum at the junction at the proximal rectum for the distal point of resection.  I mobilized the left colon in a lateral to medial fashion off the line of Toldt up towards the splenic flexure to ensure good mobilization of the remaining left colon to reach into the pelvis.  I skeletonized at the proximal mesorectum and transected at the proximal rectum using a robotic 45 mm stapler.  I chose a region at the descending/sigmoid junction that was soft and easily reached down to the rectal stump.  I transected the mesentery of the colon radially to preserve remaining colon blood supply.  I created an extraction incision through a small Pfannenstiel incision in the suprapubic region.  Placed a wound protector.  I was able to eviscerate the rectosigmoid and descending colon out the wound.   I clamped the colon proximal to this area using a reusable pursestringer device.  Passed a 2-0 Keith needle. I transected at the descending/sigmoid junction with a scalpel. I got healthy bleeding mucosa.  We sent the rectosigmoid colon specimen off to go to pathology.  We sized the colon orifice.  I chose a 31 EEA anvil stapler system.  I reinforced the prolene pursestring with interrupted silk suture.  I placed the anvil to the open end of the proximal remaining colon and closed around it using the pursestring.    We did copious irrigation with crystalloid solution.  Hemostasis was good.  The distal end of the remaining colon easily reached down to the rectal stump, therefore, splenic flexure mobilization was not needed.      Dr Marcello Moores scrubbed down and did gentle anal dilation and advanced the EEA stapler up the rectal stump. The spike was brought out at the  provimal end of the rectal stump under direct visualization.  I attached the anvil of the proximal colon the spike of the stapler. Anvil was tightened down and held clamped for 60 seconds. The EEA stapler was fired and held clamped for 30 seconds. The stapler was released & removed. We noted 2 excellent anastomotic rings. Blue stitch is in the proximal ring.  Dr Marcello Moores did rigid proctoscopy noted the anastomosis was at 14 cm from the anal verge consistent with the proximal rectum.  The rectum was insufflated the rectum while clamping the colon proximal to that anastomosis.  There was a negative air leak test. There was no tension of mesentery or bowel at the anastomosis.   Tissues looked viable.  Ureters & bowel uninjured.  The anastomosis looked healthy.  Endoluminal gas was evacuated.  Ports & wound protector removed.  We changed gloves & redraped the patient per colon SSI prevention protocol.  We aspirated the antibiotic irrigation.  Hemostasis was good.  Sterile unused instruments were used from this point.  I closed the skin at the port sites using Monocryl stitch and sterile dressing.  I closed the extraction wound using a 0 Vicryl vertical peritoneal closure and a #1 PDS transverse anterior rectal fascial closure like a small Pfannenstiel closure. I closed the skin with some interrupted Monocryl stitches. I placed antibiotic-soaked wicks into the closure at the corners x2.  I placed sterile dressings.     Patient is being extubated go to recovery room. I had discussed postop care with the patient in detail the office & in the holding area. Instructions are written.  I discussed operative findings, updated the patient's status, discussed probable steps to recovery, and gave postoperative recommendations to the patient's spouse.  Recommendations were made.  Questions were answered.  She expressed understanding & appreciation.   [name], M.D., F.A.C.S. Gastrointestinal and Minimally Invasive  Surgery [facility] [address], [place], [place] [address] [phone] Main / Paging

## 2019-08-10 NOTE — Anesthesia Procedure Notes (Signed)
Procedure Name: Intubation Date/Time: 08/10/2019 1:31 PM Performed by: Montel Clock, CRNA Pre-anesthesia Checklist: Patient identified, Emergency Drugs available, Suction available, Patient being monitored and Timeout performed Patient Re-evaluated:Patient Re-evaluated prior to induction Oxygen Delivery Method: Circle system utilized Preoxygenation: Pre-oxygenation with 100% oxygen Induction Type: IV induction Ventilation: Mask ventilation without difficulty and Oral airway inserted - appropriate to patient size Laryngoscope Size: Mac and 3 Grade View: Grade II Tube type: Oral Tube size: 7.5 mm Number of attempts: 1 Airway Equipment and Method: Stylet Placement Confirmation: ETT inserted through vocal cords under direct vision,  positive ETCO2 and breath sounds checked- equal and bilateral Secured at: 22 cm Tube secured with: Tape Dental Injury: Teeth and Oropharynx as per pre-operative assessment

## 2019-08-10 NOTE — Discharge Instructions (Signed)
SURGERY: POST OP INSTRUCTIONS °(Surgery for small bowel obstruction, colon resection, etc) ° ° °###################################################################### ° °EAT °Gradually transition to a high fiber diet with a fiber supplement over the next few days after discharge ° °WALK °Walk an hour a day.  Control your pain to do that.   ° °CONTROL PAIN °Control pain so that you can walk, sleep, tolerate sneezing/coughing, go up/down stairs. ° °HAVE A BOWEL MOVEMENT DAILY °Keep your bowels regular to avoid problems.  OK to try a laxative to override constipation.  OK to use an antidairrheal to slow down diarrhea.  Call if not better after 2 tries ° °CALL IF YOU HAVE PROBLEMS/CONCERNS °Call if you are still struggling despite following these instructions. °Call if you have concerns not answered by these instructions ° °###################################################################### ° ° °DIET °Follow a light diet the first few days at home.  Start with a bland diet such as soups, liquids, starchy foods, low fat foods, etc.  If you feel full, bloated, or constipated, stay on a ful liquid or pureed/blenderized diet for a few days until you feel better and no longer constipated. °Be sure to drink plenty of fluids every day to avoid getting dehydrated (feeling dizzy, not urinating, etc.). °Gradually add a fiber supplement to your diet over the next week.  Gradually get back to a regular solid diet.  Avoid fast food or heavy meals the first week as you are more likely to get nauseated. °It is expected for your digestive tract to need a few months to get back to normal.  It is common for your bowel movements and stools to be irregular.  You will have occasional bloating and cramping that should eventually fade away.  Until you are eating solid food normally, off all pain medications, and back to regular activities; your bowels will not be normal. °Focus on eating a low-fat, high fiber diet the rest of your life  (See Getting to Good Bowel Health, below). ° °CARE of your INCISION or WOUND °It is good for closed incision and even open wounds to be washed every day.  Shower every day.  Short baths are fine.  Wash the incisions and wounds clean with soap & water.    °If you have a closed incision(s), wash the incision with soap & water every day.  You may leave closed incisions open to air if it is dry.   You may cover the incision with clean gauze & replace it after your daily shower for comfort. °If you have skin tapes (Steristrips) or skin glue (Dermabond) on your incision, leave them in place.  They will fall off on their own like a scab.  You may trim any edges that curl up with clean scissors.  If you have staples, set up an appointment for them to be removed in the office in 10 days after surgery.  °If you have a drain, wash around the skin exit site with soap & water and place a new dressing of gauze or band aid around the skin every day.  Keep the drain site clean & dry.    °If you have an open wound with packing, see wound care instructions.  In general, it is encouraged that you remove your dressing and packing, shower with soap & water, and replace your dressing once a day.  Pack the wound with clean gauze moistened with normal (0.9%) saline to keep the wound moist & uninfected.  Pressure on the dressing for 30 minutes will stop most wound   bleeding.  Eventually your body will heal & pull the open wound closed over the next few months.  °Raw open wounds will occasionally bleed or secrete yellow drainage until it heals closed.  Drain sites will drain a little until the drain is removed.  Even closed incisions can have mild bleeding or drainage the first few days until the skin edges scab over & seal.   °If you have an open wound with a wound vac, see wound vac care instructions. ° ° ° ° °ACTIVITIES as tolerated °Start light daily activities --- self-care, walking, climbing stairs-- beginning the day after surgery.   Gradually increase activities as tolerated.  Control your pain to be active.  Stop when you are tired.  Ideally, walk several times a day, eventually an hour a day.   °Most people are back to most day-to-day activities in a few weeks.  It takes 4-8 weeks to get back to unrestricted, intense activity. °If you can walk 30 minutes without difficulty, it is safe to try more intense activity such as jogging, treadmill, bicycling, low-impact aerobics, swimming, etc. °Save the most intensive and strenuous activity for last (Usually 4-8 weeks after surgery) such as sit-ups, heavy lifting, contact sports, etc.  Refrain from any intense heavy lifting or straining until you are off narcotics for pain control.  You will have off days, but things should improve week-by-week. °DO NOT PUSH THROUGH PAIN.  Let pain be your guide: If it hurts to do something, don't do it.  Pain is your body warning you to avoid that activity for another week until the pain goes down. °You may drive when you are no longer taking narcotic prescription pain medication, you can comfortably wear a seatbelt, and you can safely make sudden turns/stops to protect yourself without hesitating due to pain. °You may have sexual intercourse when it is comfortable. If it hurts to do something, stop. ° °MEDICATIONS °Take your usually prescribed home medications unless otherwise directed.   °Blood thinners:  °Usually you can restart any strong blood thinners after the second postoperative day.  It is OK to take aspirin right away.    ° If you are on strong blood thinners (warfarin/Coumadin, Plavix, Xerelto, Eliquis, Pradaxa, etc), discuss with your surgeon, medicine PCP, and/or cardiologist for instructions on when to restart the blood thinner & if blood monitoring is needed (PT/INR blood check, etc).   ° ° °PAIN CONTROL °Pain after surgery or related to activity is often due to strain/injury to muscle, tendon, nerves and/or incisions.  This pain is usually  short-term and will improve in a few months.  °To help speed the process of healing and to get back to regular activity more quickly, DO THE FOLLOWING THINGS TOGETHER: °1. Increase activity gradually.  DO NOT PUSH THROUGH PAIN °2. Use Ice and/or Heat °3. Try Gentle Massage and/or Stretching °4. Take over the counter pain medication °5. Take Narcotic prescription pain medication for more severe pain ° °Good pain control = faster recovery.  It is better to take more medicine to be more active than to stay in bed all day to avoid medications. °1.  Increase activity gradually °Avoid heavy lifting at first, then increase to lifting as tolerated over the next 6 weeks. °Do not “push through” the pain.  Listen to your body and avoid positions and maneuvers than reproduce the pain.  Wait a few days before trying something more intense °Walking an hour a day is encouraged to help your body recover faster   and more safely.  Start slowly and stop when getting sore.  If you can walk 30 minutes without stopping or pain, you can try more intense activity (running, jogging, aerobics, cycling, swimming, treadmill, sex, sports, weightlifting, etc.) °Remember: If it hurts to do it, then don’t do it! °2. Use Ice and/or Heat °You will have swelling and bruising around the incisions.  This will take several weeks to resolve. °Ice packs or heating pads (6-8 times a day, 30-60 minutes at a time) will help sooth soreness & bruising. °Some people prefer to use ice alone, heat alone, or alternate between ice & heat.  Experiment and see what works best for you.  Consider trying ice for the first few days to help decrease swelling and bruising; then, switch to heat to help relax sore spots and speed recovery. °Shower every day.  Short baths are fine.  It feels good!  Keep the incisions and wounds clean with soap & water.   °3. Try Gentle Massage and/or Stretching °Massage at the area of pain many times a day °Stop if you feel pain - do not  overdo it °4. Take over the counter pain medication °This helps the muscle and nerve tissues become less irritable and calm down faster °Choose ONE of the following over-the-counter anti-inflammatory medications: °Acetaminophen 500mg tabs (Tylenol) 1-2 pills with every meal and just before bedtime (avoid if you have liver problems or if you have acetaminophen in you narcotic prescription) °Naproxen 220mg tabs (ex. Aleve, Naprosyn) 1-2 pills twice a day (avoid if you have kidney, stomach, IBD, or bleeding problems) °Ibuprofen 200mg tabs (ex. Advil, Motrin) 3-4 pills with every meal and just before bedtime (avoid if you have kidney, stomach, IBD, or bleeding problems) °Take with food/snack several times a day as directed for at least 2 weeks to help keep pain / soreness down & more manageable. °5. Take Narcotic prescription pain medication for more severe pain °A prescription for strong pain control is often given to you upon discharge (for example: oxycodone/Percocet, hydrocodone/Norco/Vicodin, or tramadol/Ultram) °Take your pain medication as prescribed. °Be mindful that most narcotic prescriptions contain Tylenol (acetaminophen) as well - avoid taking too much Tylenol. °If you are having problems/concerns with the prescription medicine (does not control pain, nausea, vomiting, rash, itching, etc.), please call us [phone] to see if we need to switch you to a different pain medicine that will work better for you and/or control your side effects better. °If you need a refill on your pain medication, you must call the office before 4 pm and on weekdays only.  By federal law, prescriptions for narcotics cannot be called into a pharmacy.  They must be filled out on paper & picked up from our office by the patient or authorized caretaker.  Prescriptions cannot be filled after 4 pm nor on weekends.   ° °WHEN TO CALL US [phone] °Severe uncontrolled or worsening pain  °Fever over 101 F (38.5 C) °Concerns with  the incision: Worsening pain, redness, rash/hives, swelling, bleeding, or drainage °Reactions / problems with new medications (itching, rash, hives, nausea, etc.) °Nausea and/or vomiting °Difficulty urinating °Difficulty breathing °Worsening fatigue, dizziness, lightheadedness, blurred vision °Other concerns °If you are not getting better after two weeks or are noticing you are getting worse, contact our office [phone] for further advice.  We may need to adjust your medications, re-evaluate you in the office, send you to the emergency room, or see what other things we can do to help. °The   clinic staff is available to answer your questions during regular business hours (8:30am-5pm).  Please don’t hesitate to call and ask to speak to one of our nurses for clinical concerns.    °A surgeon from [facility] is always on call at the hospitals 24 hours/day °If you have a medical emergency, go to the nearest emergency room or call 911. ° °FOLLOW UP in our office °One the day of your discharge from the hospital (or the next business weekday), please call [facility] to set up or confirm an appointment to see your surgeon in the office for a follow-up appointment.  Usually it is 2-3 weeks after your surgery.   °If you have skin staples at your incision(s), let the office know so we can set up a time in the office for the nurse to remove them (usually around 10 days after surgery). °Make sure that you call for appointments the day of discharge (or the next business weekday) from the hospital to ensure a convenient appointment time. °IF YOU HAVE DISABILITY OR FAMILY LEAVE FORMS, BRING THEM TO THE OFFICE FOR PROCESSING.  DO NOT GIVE THEM TO YOUR DOCTOR. ° °[facility] °[address], [address], [place],   [address] ? °[phone] - Main °[phone] - Toll Free,  [phone] - Fax °[email or web address] ° °GETTING TO GOOD BOWEL HEALTH. °It is  expected for your digestive tract to need a few months to get back to normal.  It is common for your bowel movements and stools to be irregular.  You will have occasional bloating and cramping that should eventually fade away.  Until you are eating solid food normally, off all pain medications, and back to regular activities; your bowels will not be normal.   °Avoiding constipation °The goal: ONE SOFT BOWEL MOVEMENT A DAY!    °Drink plenty of fluids.  Choose water first. °TAKE A FIBER SUPPLEMENT EVERY DAY THE REST OF YOUR LIFE °During your first week back home, gradually add back a fiber supplement every day °Experiment which form you can tolerate.   There are many forms such as powders, tablets, wafers, gummies, etc °Psyllium bran (Metamucil), methylcellulose (Citrucel), Miralax or Glycolax, Benefiber, Flax Seed.  °Adjust the dose week-by-week (1/2 dose/day to 6 doses a day) until you are moving your bowels 1-2 times a day.  Cut back the dose or try a different fiber product if it is giving you problems such as diarrhea or bloating. °Sometimes a laxative is needed to help jump-start bowels if constipated until the fiber supplement can help regulate your bowels.  If you are tolerating eating & you are farting, it is okay to try a gentle laxative such as double dose MiraLax, prune juice, or Milk of Magnesia.  Avoid using laxatives too often. °Stool softeners can sometimes help counteract the constipating effects of narcotic pain medicines.  It can also cause diarrhea, so avoid using for too long. °If you are still constipated despite taking fiber daily, eating solids, and a few doses of laxatives, call our office. °Controlling diarrhea °Try drinking liquids and eating bland foods for a few days to avoid stressing your intestines further. °Avoid dairy products (especially milk & ice cream) for a short time.  The intestines often can lose the ability to digest lactose when stressed. °Avoid foods that cause gassiness or  bloating.  Typical foods include beans and other legumes, cabbage, broccoli, and dairy foods.  Avoid greasy, spicy, fast foods.  Every person has   some sensitivity to other foods, so listen to your body and avoid those foods that trigger problems for you. Probiotics (such as active yogurt, Align, etc) may help repopulate the intestines and colon with normal bacteria and calm down a sensitive digestive tract Adding a fiber supplement gradually can help thicken stools by absorbing excess fluid and retrain the intestines to act more normally.  Slowly increase the dose over a few weeks.  Too much fiber too soon can backfire and cause cramping & bloating. It is okay to try and slow down diarrhea with a few doses of antidiarrheal medicines.   Bismuth subsalicylate (ex. Kayopectate, Pepto Bismol) for a few doses can help control diarrhea.  Avoid if pregnant.   Loperamide (Imodium) can slow down diarrhea.  Start with one tablet (2mg ) first.  Avoid if you are having fevers or severe pain.  ILEOSTOMY PATIENTS WILL HAVE CHRONIC DIARRHEA since their colon is not in use.    Drink plenty of liquids.  You will need to drink even more glasses of water/liquid a day to avoid getting dehydrated. Record output from your ileostomy.  Expect to empty the bag every 3-4 hours at first.  Most people with a permanent ileostomy empty their bag 4-6 times at the least.   Use antidiarrheal medicine (especially Imodium) several times a day to avoid getting dehydrated.  Start with a dose at bedtime & breakfast.  Adjust up or down as needed.  Increase antidiarrheal medications as directed to avoid emptying the bag more than 8 times a day (every 3 hours). Work with your wound ostomy nurse to learn care for your ostomy.  See ostomy care instructions. TROUBLESHOOTING IRREGULAR BOWELS 1) Start with a soft & bland diet. No spicy, greasy, or fried foods.  2) Avoid gluten/wheat or dairy products from diet to see if symptoms improve. 3) Miralax  17gm or flax seed mixed in [place]. water or juice-daily. May use 2-4 times a day as needed. 4) Gas-X, Phazyme, etc. as needed for gas & bloating.  5) Prilosec (omeprazole) over-the-counter as needed 6)  Consider probiotics (Align, Activa, etc) to help calm the bowels down  Call your doctor if you are getting worse or not getting better.  Sometimes further testing (cultures, endoscopy, X-ray studies, CT scans, bloodwork, etc.) may be needed to help diagnose and treat the cause of the diarrhea. [facility], [place], [place], [place], [place]  [address] [phone] - Main.    [phone]  - Toll Free.   [phone] - Fax [email or web address]  Colorectal Cancer  Colorectal cancer is an abnormal growth of cells and tissue (tumor) in the colon or rectum, which are parts of the large intestine. The cancer can spread (metastasize) to other parts of the body. What are the causes? Most cases of colorectal cancer start as abnormal growths called polyps on the inner wall of the colon or rectum. Other times, abnormal changes to genes (genetic mutations) can cause cells to form cancer. What increases the risk? You are more likely to develop this condition if:  You are older than age 26.  You have multiple polyps in the colon or rectum.  You have diabetes.  You are African American.  You have a family history of Lynch syndrome.  You have had cancer before.  You have certain hereditary conditions, such as: ? Familial adenomatous polyposis. ? Turcot syndrome. ? Peutz-Jeghers syndrome.  You eat a diet that is high in fat (especially animal fat) and low  in fiber, fruits, and vegetables.  You have an inactive (sedentary) lifestyle.  You have an inflammatory bowel disease or Crohn's disease.  You smoke.  You drink alcohol excessively. What are the signs or symptoms? Early colorectal cancer often does not cause symptoms. As the cancer grows,  symptoms may include:  Changes in bowel habits.  Feeling like the bowel does not empty completely after a bowel movement.  Stools that are narrower than usual.  Blood in the stool.  Diarrhea.  Constipation.  Anemia.  Discomfort, pain, bloating, fullness, or cramps in the abdomen.  Frequent gas pain.  Unexplained weight loss.  Constant fatigue.  Nausea and vomiting. How is this diagnosed? This condition may be diagnosed with:  A medical history.  A physical exam.  Tests. These may include: ? A digital rectal exam. ? A stool test called a fecal occult blood test. ? Blood tests. ? A test in which a tissue sample is taken from the colon or rectum and examined under a microscope (biopsy). ? Imaging tests, such as:  X-rays.  A barium enema.  CT scans.  MRIs.  A sigmoidoscopy. This test is done to view the inside of the rectum.  A colonoscopy. This test is done to view the inside of the colon. During this test, small polyps can be removed or biopsies may be taken.  An endorectal ultrasound. This test checks how deep a tumor in the rectum has grown and whether the cancer has spread to lymph nodes or other nearby tissues. Your health care provider may order additional tests to find out whether the cancer has spread to other parts of the body (what stage it is). The stages of cancer include:  Stage 0. At this stage, the cancer is found only in the innermost lining of the colon or rectum.  Stage I. At this stage, the cancer has grown into the inner wall of the colon or rectum.  Stage II. At this stage, the cancer has gone more deeply into the wall of the colon or rectum or through the wall. It may have invaded nearby tissue.  Stage III. At this stage, the cancer has spread to nearby lymph nodes.  Stage IV. At this stage, the cancer has spread to other parts of the body, such as the liver or lungs. How is this treated? Treatment for this condition depends on the  type and stage of the cancer. Treatment may include:  Surgery. In the early stages of the cancer, surgery may be done to remove polyps or small tumors from the colon. In later stages, surgery may be done to remove part of the colon.  Chemotherapy. This treatment uses medicines to kill cancer cells.  Targeted therapy. This treatment targets specific gene mutations or proteins that the cancer expresses in order to kill tumor cells.  Radiation therapy. This treatment uses radiation to kill cancer cells or shrink tumors.  Radiofrequency ablation. This treatment uses radio waves to destroy the tumors that may have spread to other areas of the body, such as the liver. Follow these instructions at home:  Take over-the-counter and prescription medicines only as told by your health care provider.  Try to eat regular, healthy meals. Some of your treatments might affect your appetite. If you are having problems eating or with your appetite, ask to meet with a food and nutrition specialist (dietitian).  Consider joining a support group. This may help you learn about your diagnosis and cope with the stress of having colorectal  cancer.  If you are admitted to the hospital, inform your cancer care team.  Keep all follow-up visits as told by your health care provider. This is important. How is this prevented?  Colorectal cancer can be prevented with screening tests that find polyps so they can be removed before they develop into cancer.  All adults should have screening for colorectal cancer starting at age 75 and continuing until age 51. Your health care provider may recommend screening at age 3. People at increased risk should start screening at an earlier age. Where to find more information  American Cancer Society: https://www.cancer.[place]): https://www.cancer.gov Contact a health care provider if:  Your diarrhea or constipation does not go away.  You have blood  in your stool.  Your bowel habits change.  You have increased pain in your abdomen.  You notice new fatigue or weakness.  You lose weight. Get help right away if:  You have increased bleeding from your rectum.  You have any uncontrollable or severe abdomen (abdominal) symptoms. Summary  Colorectal cancer is an abnormal growth of cells and tissue (tumor) in the colon or rectum.  Common risk factors for this condition include having a relative with colon cancer, being older in age, having an inflammatory bowel disease, and being African American.  This condition may be diagnosed with tests, such as a colonoscopy and biopsy.  Treatment depends on the type and stage of the cancer. Commonly, treatment includes surgery to remove the tumor along with chemotherapy or targeted therapy.  Keep all follow-up visits as told by your health care provider. This is important. This information is not intended to replace advice given to you by your health care provider. Make sure you discuss any questions you have with your health care provider. Document Revised: 06/12/2017 Document Reviewed: 05/24/2016 Elsevier Patient Education  [place].

## 2019-08-10 NOTE — Anesthesia Postprocedure Evaluation (Signed)
Anesthesia Post Note  Patient: Richard Washington  Procedure(s) Performed: XI ROBOT ASSISTED LAPAROSCOPIC SIGMOID COLECTOMY W/ RIGID PROCTOSCOPY (N/A Abdomen)     Patient location during evaluation: PACU Anesthesia Type: General Level of consciousness: awake and alert and oriented Pain management: pain level controlled Vital Signs Assessment: post-procedure vital signs reviewed and stable Respiratory status: spontaneous breathing, nonlabored ventilation and respiratory function stable Cardiovascular status: blood pressure returned to baseline and stable Postop Assessment: no apparent nausea or vomiting Anesthetic complications: no    Last Vitals:  Vitals:   08/10/19 1645 08/10/19 1712  BP: (!) 151/92 (!) 163/93  Pulse: 70 86  Resp: 10 16  Temp: 36.5 C 36.7 C  SpO2: 98% 99%    Last Pain:  Vitals:   08/10/19 1630  TempSrc:   PainSc: 4                  Vilas Edgerly A.

## 2019-08-10 NOTE — Transfer of Care (Signed)
Immediate Anesthesia Transfer of Care Note  Patient: Richard Washington  Procedure(s) Performed: XI ROBOT ASSISTED LAPAROSCOPIC SIGMOID COLECTOMY W/ RIGID PROCTOSCOPY (N/A Abdomen)  Patient Location: PACU  Anesthesia Type:General  Level of Consciousness: drowsy  Airway & Oxygen Therapy: Patient Spontanous Breathing and Patient connected to face mask oxygen  Post-op Assessment: Report given to RN and Post -op Vital signs reviewed and stable  Post vital signs: Reviewed and stable  Last Vitals:  Vitals Value Taken Time  BP 153/67 08/10/19 1604  Temp    Pulse 73 08/10/19 1611  Resp 16 08/10/19 1611  SpO2 100 % 08/10/19 1611  Vitals shown include unvalidated device data.  Last Pain:  Vitals:   08/10/19 1127  TempSrc:   PainSc: 0-No pain      Patients Stated Pain Goal: 4 ([id number])  Complications: No apparent anesthesia complications

## 2019-08-10 NOTE — H&P (Signed)
Richard Washington  Documented: 07/05/2019 2:43 PM  Location: [facility]  Patient #: [id number]  DOB: 12/05/1940  Married / Language: Cleophus Molt / Race: White  Male  History of Present Illness [name] MD; 07/05/2019 5:04 PM)  The patient is a 79 year old male who presents with colorectal cancer. Note for "Colorectal cancer": `  `  `  Patient sent for surgical consultation at the request of Dr Orrin Brigham  Chief Complaint: Sigmoid colon cancer in the setting of severe calcific aortic stenosis.  `  `  The patient is a rather active elderly male. He comes today with his wife History of glaucoma, restless leg syndrome, melanoma. Had hematochezia and underwent colonoscopy. Sigmoid cancer revealed. Dr. Lilia Pro with surgery detected a heart murmur and sent patient for evaluation. Patient had good left ventricular systolic function with an LVEF of 55-60 percent, his aortic valve was found to be bicuspid with severely thickened leaflet. Aortic stenosis with a mean gradient of 41 mmHg. Seen by Dr. Harriet Masson down in [place] and referred up to see Dr. Angelena Form at the [facility] through [place]. Seen last week. Patient has had six surgeries (back, inguinal hernia ,and hip surgeries) in the past decade without incident. Patient does recall his mother was diagnosed with colon cancer when she was older. He walks the dog's and a daily basis. Thinks he doesn't least a 20 minute walk. He still occasionally gets some left calf and foot numbness when he walks. Doesn't related to his back or hip surgery.  No personal nor family history of inflammatory bowel disease, irritable bowel syndrome, allergy such as Celiac Sprue, dietary/dairy problems, colitis, ulcers nor gastritis. No recent sick contacts/gastroenteritis. No travel outside the country. No changes in diet. No dysphagia to solids or liquids. No significant heartburn or reflux. No hematochezia, hematemesis, coffee ground emesis. No  evidence of prior gastric/peptic ulceration.  (Review of systems as stated in this history (HPI) or in the review of systems. Otherwise all other 12 point ROS are negative)  `  `  `  This patient encounter took 55 minutes today to perform the following: obtain history, perform exam, review outside records, interpret tests & imaging, counsel the patient on their diagnosis; and, document this encounter, including findings & plan in the electronic health record (EHR).  Past Surgical History Antonietta Jewel, [place]; 07/05/2019 2:43 PM)  Spinal Surgery - Lower Back  Diagnostic Studies History (Chanel Teressa Senter, [place]; 07/05/2019 2:43 PM)  Colonoscopy within last year  Allergies (Chanel Teressa Senter, CMA; 07/05/2019 2:44 PM)  No Known Allergies [07/05/2019]:  No Known Drug Allergies [07/05/2019]:  Allergies Reconciled  Medication History (Chanel Teressa Senter, CMA; 07/05/2019 2:44 PM)  Latanoprost (Ophthalmic) Specific strength unknown - Active.  Medications Reconciled  Social History Antonietta Jewel, CMA; 07/05/2019 2:43 PM)  Caffeine use Carbonated beverages, Coffee, Tea.  No alcohol use  No drug use  Tobacco use Never smoker.  Family History Antonietta Jewel, [place]; 07/05/2019 2:43 PM)  Arthritis Sister.  Colon Cancer Father.  Heart Disease Father, Mother.  Other Problems (Chanel Teressa Senter, CMA; 07/05/2019 2:43 PM)  Back Pain  Heart murmur  Hemorrhoids  Inguinal Hernia  Ventral Hernia Repair  Review of Systems (Chanel Nolan CMA; 07/05/2019 2:43 PM)  General Not Present- Appetite Loss, Chills, Fatigue, Fever, Night Sweats, Weight Gain and Weight Loss.  Skin Not Present- Change in Wart/Mole, Dryness, Hives, Jaundice, New Lesions, Non-Healing Wounds, Rash and Ulcer.  HEENT Not Present- Earache, Hearing Loss, Hoarseness, Nose  Bleed, Oral Ulcers, Ringing in the Ears, Seasonal Allergies, Sinus Pain, Sore Throat, Visual Disturbances, Wears glasses/contact lenses and Yellow Eyes.  Respiratory Not Present- Bloody sputum, Chronic Cough,  Difficulty Breathing, Snoring and Wheezing.  Breast Not Present- Breast Mass, Breast Pain, Nipple Discharge and Skin Changes.  Cardiovascular Not Present- Chest Pain, Difficulty Breathing Lying Down, Leg Cramps, Palpitations, Rapid Heart Rate, Shortness of Breath and Swelling of Extremities.  Gastrointestinal Present- Bloody Stool, Change in Bowel Habits and Hemorrhoids. Not Present- Abdominal Pain, Bloating, Chronic diarrhea, Constipation, Difficulty Swallowing, Excessive gas, Gets full quickly at meals, Indigestion, Nausea, Rectal Pain and Vomiting.  Male Genitourinary Not Present- Blood in Urine, Change in Urinary Stream, Frequency, Impotence, Nocturia, Painful Urination, Urgency and Urine Leakage.  Musculoskeletal Not Present- Back Pain, Joint Pain, Joint Stiffness, Muscle Pain, Muscle Weakness and Swelling of Extremities.  Neurological Present- Numbness. Not Present- Decreased Memory, Fainting, Headaches, Seizures, Tingling, Tremor, Trouble walking and Weakness.  Psychiatric Not Present- Anxiety, Bipolar, Change in Sleep Pattern, Depression, Fearful and Frequent crying.  Endocrine Not Present- Cold Intolerance, Excessive Hunger, Hair Changes, Heat Intolerance, Hot flashes and New Diabetes.  Hematology Not Present- Blood Thinners, Easy Bruising, Excessive bleeding, Gland problems, HIV and Persistent Infections.  Vitals (Chanel Nolan CMA; 07/05/2019 2:45 PM)  07/05/2019 2:44 PM  Weight: 184.13 lb Height: 68 in  Body Surface Area: 1.97 m Body Mass Index: 28 kg/m  Temp.: 98.2 F Pulse: 93 (Regular)  BP: 132/72 (Sitting, Left Arm, Standard)  Physical Exam [name] MD; 07/05/2019 5:03 PM)  General  Mental Status - Alert.  General Appearance - Not in acute distress, Not Sickly.  Orientation - Oriented X3.  Hydration - Well hydrated.  Voice - Normal.  Integumentary  Global Assessment  Upon inspection and palpation of skin surfaces of the - Axillae: non-tender, no inflammation or ulceration,  no drainage. and Distribution of scalp and body hair is normal.  General Characteristics  Temperature - normal warmth is noted.  Head and Neck  Head - normocephalic, atraumatic with no lesions or palpable masses.  Face  Global Assessment - atraumatic, no absence of expression.  Neck  Global Assessment - no abnormal movements, no bruit auscultated on the right, no bruit auscultated on the left, no decreased range of motion, non-tender.  Trachea - midline.  Thyroid  Gland Characteristics - non-tender.  Eye  Eyeball - Left - Extraocular movements intact, No Nystagmus - Left.  Eyeball - Right - Extraocular movements intact, No Nystagmus - Right.  Cornea - Left - No Hazy - Left.  Cornea - Right - No Hazy - Right.  Sclera/Conjunctiva - Left - No scleral icterus, No Discharge - Left.  Sclera/Conjunctiva - Right - No scleral icterus, No Discharge - Right.  Pupil - Left - Direct reaction to light normal.  Pupil - Right - Direct reaction to light normal.  ENMT  Ears  Pinna - Left - no drainage observed, no generalized tenderness observed. Pinna - Right - no drainage observed, no generalized tenderness observed.  Nose and Sinuses  External Inspection of the Nose - no destructive lesion observed. Inspection of the nares - Left - quiet respiration. Inspection of the nares - Right - quiet respiration.  Mouth and Throat  Lips - Upper Lip - no fissures observed, no pallor noted. Lower Lip - no fissures observed, no pallor noted. Nasopharynx - no discharge present. Oral Cavity/Oropharynx - Tongue - no dryness observed. Oral Mucosa - no cyanosis observed. Hypopharynx - no evidence of airway  distress observed.  Chest and Lung Exam  Inspection  Movements - Normal and Symmetrical. Accessory muscles - No use of accessory muscles in breathing.  Palpation  Palpation of the chest reveals - Non-tender.  Auscultation  Breath sounds - Normal and Clear.  Cardiovascular  Auscultation  Rhythm - Regular.  Murmurs & Other Heart Sounds - Auscultation of the heart reveals - No Murmurs and No Systolic Clicks.  Abdomen  Inspection  Inspection of the abdomen reveals - No Visible peristalsis and No Abnormal pulsations. Umbilicus - No Bleeding, No Urine drainage.  Palpation/Percussion  Palpation and Percussion of the abdomen reveal - Soft, Non Tender, No Rebound tenderness, No Rigidity (guarding) and No Cutaneous hyperesthesia.  Note: Abdomen soft. Nontender. Not distended. No umbilical or incisional hernias. No guarding.  Male Genitourinary  Sexual Maturity  Tanner 5 - Adult hair pattern and Adult penile size and shape.  Note: Some thinning and right groin but no recurrent inguinal hernia. No left inguinal hernia. No lymphadenopathy. Otherwise normal external genitalia  Rectal  Note: Deferred given recent colonoscopy and digital rectal exam.  Peripheral Vascular  Upper Extremity  Inspection - Left - No Cyanotic nailbeds - Left, Not Ischemic. Inspection - Right - No Cyanotic nailbeds - Right, Not Ischemic.  Neurologic  Neurologic evaluation reveals - normal attention span and ability to concentrate, able to name objects and repeat phrases. Appropriate fund of knowledge , normal sensation and normal coordination.  Mental Status  Affect - not angry, not paranoid.  Cranial Nerves - Normal Bilaterally.  Gait - Normal.  Neuropsychiatric  Mental status exam performed with findings of - able to articulate well with normal speech/language, rate, volume and coherence, thought content normal with ability to perform basic computations and apply abstract reasoning and no evidence of hallucinations, delusions, obsessions or homicidal/suicidal ideation.  Musculoskeletal  Global Assessment  Spine, Ribs and Pelvis - no instability, subluxation or laxity. Right Upper Extremity - no instability, subluxation or laxity.  Note: Moderate kyphoscoloisos left-sided. Posture pretty good  Lymphatic  Head & Neck  General  Head & Neck Lymphatics: Bilateral - Description - No Localized lymphadenopathy.  Axillary  General Axillary Region: Bilateral - Description - No Localized lymphadenopathy.  Femoral & Inguinal  Generalized Femoral & Inguinal Lymphatics: Left - Description - No Localized lymphadenopathy. Right - Description - No Localized lymphadenopathy.  Assessment & Plan [name] MD; 07/05/2019 5:05 PM)  ADENOCARCINOMA OF SIGMOID COLON (C18.7)  Impression: Adenocarcinoma around 45 cm from anal verge according to Dr. Carmie End colonoscopy. This seems to correlate with the proximal sigmoid apple core like lesion on his CAT scan. No obvious evidence of any abdominal pelvic metastatic disease.  CEA 1.4 reassuring.  He needs CT of chest for completion workup  Standard of care would be segmental colonic resection. Reasonable for a minimally invasive/robotic approach. Plan to do it was a long with anesthesia available to help sort out. If as perioperative concerns, transfer to [facility] postoperatively. Hopefully not too likely given his good exercise tolerance and tolerance of 6 surgeries in the past decade. However, we need to make sure we have backup plans ready in case.  Current Plans  CT, thorax; w contrast material(s)(71260)(ACR 0 )(DSN [id number])(G-Code G1004(MG)) (Clinical Scenarios: No content available for this procedure; ; Pt needs CT Chest w/contrast for staging scan of new dx sigmoid colon cancer.)  PREOP COLON - ENCOUNTER FOR PREOPERATIVE EXAMINATION FOR GENERAL SURGICAL PROCEDURE (Z01.818)  Current Plans  You are being scheduled for surgery -  Our schedulers will call you.  You should hear from our office's scheduling department within 5 working days about the location, date, and time of surgery. We try to make accommodations for patient's preferences in scheduling surgery, but sometimes the OR schedule or the surgeon's schedule prevents [place] from making those accommodations.  If you have not heard from  our office [phone]) in 5 working days, call the office and ask for your surgeon's nurse.  If you have other questions about your diagnosis, plan, or surgery, call the office and ask for your surgeon's nurse.  Written instructions provided  The anatomy & physiology of the digestive tract was discussed. The pathophysiology of the colon was discussed. Natural history risks without surgery was discussed. I feel the risks of no intervention will lead to serious problems that outweigh the operative risks; therefore, I recommended a partial colectomy to remove the pathology. Minimally invasive (Robotic/Laparoscopic) & open techniques were discussed.  Risks such as bleeding, infection, abscess, leak, reoperation, possible ostomy, hernia, heart attack, death, and other risks were discussed. I noted a good likelihood this will help address the problem. Goals of post-operative recovery were discussed as well. Need for adequate nutrition, daily bowel regimen and healthy physical activity, to optimize recovery was noted as well. We will work to minimize complications. Educational materials were available as well. Questions were answered. The patient expresses understanding & wishes to proceed with surgery.  Pt Education - [facility] Colon Bowel Prep 2018 ERAS/Miralax/Antibiotics  Started Neomycin Sulfate 500 MG Oral Tablet, 2 (two) Tablet SEE NOTE, #6, 07/05/2019, No Refill.  Local Order:  Pharmacist Notes: TAKE TWO TABLETS AT 2 PM, 3 PM, AND 10 PM THE DAY PRIOR TO SURGERY  Started Flagyl 500 MG Oral Tablet, 2 (two) Tablet SEE NOTE, #6, 07/05/2019, No Refill.  Local Order:  Pharmacist Notes: Take at 2pm, 3pm, and 10pm the day prior to your colon operation  Pt Education - Pamphlet Given - Laparoscopic Colorectal Surgery: discussed with patient and provided information.  Pt Education - [facility] Colectomy post-op instructions: discussed with patient and provided information.  SEVERE CALCIFIC AORTIC STENOSIS (I35.0)    Impression: Incidentally noted calcific aortic stenosis initially heard with heart murmur followed up by echocardiogram. He's been evaluated by Dr. Harriet Masson & Angelena Form [facility]. Because it is not critical AS the patient has good exercise tolerance, the feeling by them as to proceed with colon cancer surgery first and then they will help sort out his aortic stenosis with possible TAVR or other interventions.  Plan minimally invasive robotic resection with low threshold for cardiology consultation postoperatively.  Current Plans  I recommended obtaining preoperative cardiac clearance. I am concerned about the health of the patient and the ability to tolerate the operation. Therefore, we will request clearance by cardiology to better assess operative risk & see if a reevaluation, further workup, etc is needed. Also recommendations on how medications such as for anticoagulation and blood pressure should be managed/held/restarted after surgery.  [name], MD, FACS, MASCRS  Gastrointestinal and Minimally Invasive Surgery  [facility]  [address], [place]  [place], [place] [address]  [phone] Main / Paging  [phone] Fax    Electronically signed by Michael Boston, MD at 07/05/2019 5:06 PM     Patient ready for surgery.  Good performance status.  Cardiac anesthesia available at [facility].  D/w Dr Royce Macadamia

## 2019-08-10 NOTE — Anesthesia Procedure Notes (Signed)
Arterial Line Insertion Start/End4/10/2019 2:30 PM, 08/10/2019 2:35 PM Performed by: Claudia Desanctis, CRNA, CRNA  Preanesthetic checklist: patient identified, IV checked, site marked, risks and benefits discussed, surgical consent, monitors and equipment checked, pre-op evaluation, timeout performed and anesthesia consent Lidocaine 1% used for infiltration radial was placed Catheter size: 20 G Hand hygiene performed  and Seldinger technique used  Attempts: 1 Procedure performed without using ultrasound guided technique. Following insertion, Biopatch and dressing applied. Post procedure assessment: normal  Patient tolerated the procedure well with no immediate complications.

## 2019-08-11 DIAGNOSIS — H409 Unspecified glaucoma: Secondary | ICD-10-CM | POA: Diagnosis present

## 2019-08-11 DIAGNOSIS — G2581 Restless legs syndrome: Secondary | ICD-10-CM | POA: Diagnosis present

## 2019-08-11 DIAGNOSIS — R200 Anesthesia of skin: Secondary | ICD-10-CM

## 2019-08-11 DIAGNOSIS — M4807 Spinal stenosis, lumbosacral region: Secondary | ICD-10-CM | POA: Diagnosis present

## 2019-08-11 LAB — MAGNESIUM: Magnesium: 1.9 mg/dL (ref 1.7–2.4)

## 2019-08-11 LAB — BASIC METABOLIC PANEL
Anion gap: 6 (ref 5–15)
BUN: 13 mg/dL (ref 8–23)
CO2: 26 mmol/L (ref 22–32)
Calcium: 9 mg/dL (ref 8.9–10.3)
Chloride: 107 mmol/L (ref 98–111)
Creatinine, Ser: 0.74 mg/dL (ref 0.61–1.24)
GFR calc Af Amer: >60 mL/min (ref >60)
GFR calc non Af Amer: >60 mL/min (ref >60)
Glucose, Bld: 104 mg/dL — ABNORMAL HIGH (ref 70–99)
Potassium: 4.9 mmol/L (ref 3.5–5.1)
Sodium: 139 mmol/L (ref 135–145)

## 2019-08-11 LAB — CBC
HCT: 37.6 % — ABNORMAL LOW (ref 39.0–52.0)
Hemoglobin: 12.3 g/dL — ABNORMAL LOW (ref 13.0–17.0)
MCH: 32.7 pg (ref 26.0–34.0)
MCHC: 32.7 g/dL (ref 30.0–36.0)
MCV: 100 fL (ref 80.0–100.0)
Platelets: 188 10*3/uL (ref 150–400)
RBC: 3.76 MIL/uL — ABNORMAL LOW (ref 4.22–5.81)
RDW: 13.4 % (ref 11.5–15.5)
WBC: 8.7 10*3/uL (ref 4.0–10.5)
nRBC: 0 % (ref 0.0–0.2)

## 2019-08-11 MED ORDER — CHLORHEXIDINE GLUCONATE CLOTH 2 % EX PADS
6.0000 | MEDICATED_PAD | Freq: Every day | CUTANEOUS | Status: DC
  Administered 2019-08-11: 6 via TOPICAL

## 2019-08-11 NOTE — Progress Notes (Signed)
Richard Washington [id number] Mar 14, 1941  CARE TEAM:  PCP: Mateo Flow, MD  Outpatient Care Team: Patient Care Team: Mateo Flow, MD as PCP - General (Family Medicine) Berniece Salines, DO as PCP - Cardiology (Cardiology) Michael Boston, MD as Consulting Physician (Colon and Rectal Surgery) Burnell Blanks, MD as Consulting Physician (Cardiology) Pollyann Samples, MD as Consulting Physician (General Surgery) Nehemiah Settle, MD as Consulting Physician (Gastroenterology)  Inpatient Treatment Team: Treatment Team: Attending Provider: Michael Boston, MD; Technician: Leda Quail, NT; Registered Nurse: Jennye Boroughs, RN; Social Worker: Lowella Curb, LCSW; Utilization Review: Delrae Sawyers, RN   Problem List:   Principal Problem:   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 Sigmoid Colon Cancer  [facility] Stay = 1 days)  Plan: 1. Patient was able to walk down the hall yesterday evening and is to continue mobility as tolerated. We recommend occupational therapy and physical therapy evaluate him as he states he uses a walking device at home. He has moved his bowels twice since yesterday so we will discontinue Alvimopan. His pain is well controlled and he reports feeling well on Gabapentin and Tylenol- can be given Tramadol PRN if pain worsens. Patient able to tolerate clear liquids and can be transitioned to dysphagia 1 diet at this time. Continue VTE prophylaxis and incentive spirometry. Foley catheter to stay in place until POD2, continue strict I/O monitoring. Dressings to be changed every 48 hours unless indicated sooner. Continue usual care.  30 minutes  spent in review, evaluation, examination, counseling, and coordination of care.  More than 50% of that time was spent in counseling. Patient's questions were answered and he verbalized understanding of our plan.   08/11/2019  Subjective: Post-op day 1 from laparascopic sigmoid colectomy.  Patient awake, sitting up in bed, and well-appearing at time of assessment. Patient talkative and states he is feeling well. He reports that he is able to tolerate water and clear liquids. He states multiple times that he was able to walk down the hallway last night. He admits to typically using an assistive walking device at home. His pain is well controlled at this time. He has moved his bowels twice since surgery, and admits to blood in his stool this morning. We explained this is to be expected for weeks after surgery.   Objective:  Vital signs:  Vitals:   08/10/19 1944 08/11/19 0033 08/11/19 0600 08/11/19 1000  BP: (!) 144/92 (!) 144/77 136/77 137/77  Pulse: 73 69 73 80  Resp: 18 19 18 16   Temp:  97.6 F (36.4 C) 98.2 F (36.8 C) 98.6 F (37 C)  TempSrc:   Oral Oral  SpO2: 100% 100% 100% 97%  Weight:      Height:        Last BM Date: 08/11/19  Intake/Output   Yesterday:  04/06 0701 - 04/07 0700 In: 2842.9 [P.O.:1040; I.V.:1603; IV Piggyback:199.9] Out: [id number]; Blood:50] This shift:  Total I/O In: 360 [P.O.:360] Out: 650 [Urine:650]  Bowel function:  Flatus: YES  BM:  YES  Drain: (No drain)   Physical Exam:  General: Pt awake/alert in no acute distress Psych:  No delerium/psychosis/paranoia.  Oriented x 3  Chest: No pain to chest wall compression.  Good respiratory excursion.  No audible wheezing CV:  Pulses intact.  Regular rhythm.  No major extremity edema MS: Normal AROM mjr joints.  No obvious deformity  Abdomen: Soft.  Mildy distended.  Nontender.  No evidence of peritonitis.  No incarcerated hernias.  Ext:   No deformity.  No mjr edema.  No cyanosis Skin: No  petechiae / purpurea.  No major sores.  Warm and dry. Surgical wounds appropriately dressed.   Results:   Cultures: Recent Results (from the past 720 hour(s))  SARS CORONAVIRUS 2 (TAT 6-24 HRS) Nasopharyngeal Nasopharyngeal Swab     Status: None   Collection Time: 08/07/19  1:53 PM   Specimen: Nasopharyngeal Swab  Result Value Ref Range Status   SARS Coronavirus 2 NEGATIVE NEGATIVE Final    Comment: (NOTE) SARS-CoV-2 target nucleic acids are NOT DETECTED. The SARS-CoV-2 RNA is generally detectable in upper and lower respiratory specimens during the acute phase of infection. Negative results do not preclude SARS-CoV-2 infection, do not rule out co-infections with other pathogens, and should not be used as the sole basis for treatment or other patient management decisions. Negative results must be combined with clinical observations, patient history, and epidemiological information. The expected result is Negative. Fact Sheet for Patients: SugarRoll.be Fact Sheet for Healthcare Providers: https://www.woods-mathews.com/ This test is not yet approved or cleared by the [place] FDA and  has been authorized for detection and/or diagnosis of SARS-CoV-2 by FDA under an Emergency Use Authorization (EUA). This EUA will remain  in effect (meaning this test can be used) for the duration of the COVID-19 declaration under Section 56 4(b)(1) of the Act, 21 U.S.C. section 360bbb-3(b)(1), unless the authorization is terminated or revoked sooner. Performed at [facility], [place] [address] [place], [place] [address]     Labs: Results for orders placed or performed during the hospital encounter of 08/10/19 (from the past 48 hour(s))  Basic metabolic panel     Status: Abnormal   Collection Time: 08/11/19  4:42 AM  Result Value Ref Range   Sodium 139 135 - 145 mmol/L   Potassium 4.9 3.5 - 5.1 mmol/L   Chloride 107 98 - 111 mmol/L   CO2 26 22 -  32 mmol/L   Glucose, Bld 104 (H) 70 - 99 mg/dL    Comment: Glucose reference range applies only to samples taken after fasting for at least 8 hours.   BUN 13 8 - 23 mg/dL   Creatinine, Ser 0.74 0.61 - 1.24 mg/dL   Calcium 9.0 8.9 - 10.3 mg/dL   GFR calc non Af Amer >60 >60 mL/min   GFR calc Af Amer >60 >60 mL/min   Anion gap 6 5 - 15    Comment: Performed at [facility], [place] [address], [place], [place] [address]  CBC     Status: Abnormal   Collection Time: 08/11/19  4:42 AM  Result Value Ref Range   WBC 8.7 4.0 - 10.5 K/uL   RBC 3.76 (L) 4.22 - 5.81 MIL/uL   Hemoglobin 12.3 (L) 13.0 - 17.0 g/dL   HCT 37.6 (L) 39.0 - 52.0 %   MCV 100.0 80.0 - 100.0 fL   MCH 32.7 26.0 - 34.0 pg   MCHC 32.7 30.0 - 36.0 g/dL   RDW 13.4 11.5 - 15.5 %   Platelets 188 150 - 400 K/uL   nRBC 0.0 0.0 - 0.2 %    Comment: Performed at  [facility], [place] [address] [place], [place] [address]  Magnesium     Status: None   Collection Time: 08/11/19  4:42 AM  Result Value Ref Range   Magnesium 1.9 1.7 - 2.4 mg/dL    Comment: Performed at [facility], [place] [address], [place], [place] [address]    Imaging / Studies: No results found.  Medications / Allergies: per chart  Antibiotics: Anti-infectives (From admission, onward)   Start     Dose/Rate Route Frequency Ordered Stop   08/11/19 0130  cefoTEtan (CEFOTAN) 2 g in sodium chloride 0.9 % 100 mL IVPB     2 g 200 mL/hr over 30 Minutes Intravenous Every 12 hours 08/10/19 1721 08/11/19 0052   08/10/19 1400  neomycin (MYCIFRADIN) tablet 1,000 mg  Status:  Discontinued     1,000 mg Oral 3 times per day 08/10/19 1109 08/10/19 1112   08/10/19 1400  metroNIDAZOLE (FLAGYL) tablet 1,000 mg  Status:  Discontinued     1,000 mg Oral 3 times per day 08/10/19 1109 08/10/19 1112   08/10/19 1115  cefoTEtan (CEFOTAN) 2 g in sodium chloride 0.9 % 100 mL IVPB     2 g 200 mL/hr over 30 Minutes Intravenous On call to  O.R. 08/10/19 1109 08/10/19 Milo PA-S Patient seen and evaluated with Dr.Steven Gwynneth Aliment, MD, FACS, MASCRS    [address], [place], [place] [address] [phone] Main / Paging [phone] Fax Please see Amion for pager number, especial 5pm - 7am.

## 2019-08-11 NOTE — Progress Notes (Signed)
Pharmacy Brief Note - Alvimopan (Entereg)  The standing order set for alvimopan (Entereg) now includes an automatic order to discontinue the drug after the patient has had a bowel movement.  The change was approved by the [place] and the Medical Executive Committee.    This patient has had a bowel movement documented by nursing.  Therefore, alvimopan has been discontinued.  If there are questions, please contact the pharmacy at [phone].  Thank you   Royetta Asal, PharmD, BCPS 08/11/2019 10:30 AM

## 2019-08-12 NOTE — Progress Notes (Signed)
Assessment unchanged. Pt and wife verbalized understanding instructions through teach back including Medications to resume, follow up care and when to call the doctor. Dc'd via wc to front entrance accompanied by NT and wife.

## 2019-08-12 NOTE — Evaluation (Signed)
Physical Therapy Evaluation Patient Details Name: Richard Washington MRN: [id number] DOB: [birth year] Today's Date: 08/12/2019   History of Present Illness  Pt is a 79 y/o male with PMH of carcinoma of sigmoid colon, claucoma, aortic stensosi, prior back surgery, B hip surgery. Presenting 08/10/19 for surgery and now s/p laparoscopic sigmoid colectomy with rigid proctoscopy.   Clinical Impression  Pt is independent with mobility, he ambulated 400' without an assistive device, no loss of balance. He denies falls in the past 1 year. From PT standpoint, he is ready to DC home, no f/u needs.     Follow Up Recommendations No PT follow up    Equipment Recommendations  None recommended by PT    Recommendations for Other Services       Precautions / Restrictions Precautions Precautions: Other (comment) Precaution Comments: abdominal incision Restrictions Weight Bearing Restrictions: No      Mobility  Bed Mobility               General bed mobility comments: OOB in recliner upon entry   Transfers Overall transfer level: Needs assistance Equipment used: None Transfers: Sit to/from Stand Sit to Stand: Modified independent (Device/Increase time)         General transfer comment: used armrests  Ambulation/Gait Ambulation/Gait assistance: Independent Gait Distance (Feet): 400 Feet Assistive device: None Gait Pattern/deviations: WFL(Within Functional Limits);Decreased step length - right Gait velocity: WFL   General Gait Details: steady, no loss of balance, mildly decreased step length R  Stairs            Wheelchair Mobility    Modified Rankin (Stroke Patients Only)       Balance Overall balance assessment: Mild deficits observed, not formally tested(pt denies h/o falls in past 1 year)                                           Pertinent Vitals/Pain Pain Assessment: No/denies pain    Home Living Family/patient expects to be discharged to::  Private residence Living Arrangements: Spouse/significant other Available Help at Discharge: Family Type of Home: House Home Access: Stairs to enter;Ramped entrance Entrance Stairs-Rails: None Entrance Stairs-Number of Steps: 1 Home Layout: One level Home Equipment: Environmental consultant - 2 wheels;Cane - single point;Bedside commode      Prior Function Level of Independence: Independent         Comments: driving, yardwork     Hand Dominance        Extremity/Trunk Assessment   Upper Extremity Assessment Upper Extremity Assessment: Defer to OT evaluation    Lower Extremity Assessment Lower Extremity Assessment: Overall WFL for tasks assessed(pt reports h/o peripheral neuropathy following a back surgery, sensation intact to light touch B feet)    Cervical / Trunk Assessment Cervical / Trunk Assessment: Kyphotic  Communication   Communication: No difficulties  Cognition Arousal/Alertness: Awake/alert Behavior During Therapy: WFL for tasks assessed/performed Overall Cognitive Status: Within Functional Limits for tasks assessed                                        General Comments      Exercises     Assessment/Plan    PT Assessment Patent does not need any further PT services  PT Problem List         PT Treatment  Interventions      PT Goals (Current goals can be found in the Care Plan section)  Acute Rehab PT Goals Patient Stated Goal: yardwork PT Goal Formulation: All assessment and education complete, DC therapy    Frequency     Barriers to discharge        Co-evaluation               AM-PAC PT "6 Clicks" Mobility  Outcome Measure Help needed turning from your back to your side while in a flat bed without using bedrails?: None Help needed moving from lying on your back to sitting on the side of a flat bed without using bedrails?: None Help needed moving to and from a bed to a chair (including a wheelchair)?: None Help needed standing  up from a chair using your arms (e.g., wheelchair or bedside chair)?: None Help needed to walk in hospital room?: None Help needed climbing 3-5 steps with a railing? : None 6 Click Score: 24    End of Session Equipment Utilized During Treatment: Gait belt Activity Tolerance: Patient tolerated treatment well Patient left: in chair;with call bell/phone within reach;with family/visitor present Nurse Communication: Mobility status      Time: [id number] PT Time Calculation (min) (ACUTE ONLY): 9 min   Charges:   PT Evaluation $PT Eval Low Complexity: 1 Low         Philomena Doheny PT 08/12/2019  Acute Rehabilitation Services Pager [phone] Office [phone]

## 2019-08-12 NOTE — Discharge Summary (Signed)
Physician Discharge Summary    Patient ID: Richard Washington MRN: [id number] DOB/AGE: [birth year]  79 y.o.  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  Postoperatively, the patient gradually mobilized and advanced to a solid diet.  Pain and other symptoms were treated aggressively.    By the time of discharge, the patient was walking well the hallways, eating food, having flatus, and urinating.  Pain was well-controlled on an oral medications.  Based on meeting discharge criteria and continuing to recover, I felt it was safe for the patient to be discharged from the hospital to further recover with close followup. Postoperative recommendations were discussed in detail.  They are written as well.  Discharged Condition: good Patient overall asymptomatic and doing well Patient cleared for discharge by Occupational Therapy, we appreciate their  coordination in his care  Will f/u on Pathology Patient to f/u in office in 3 weeks  Discharge Exam: Blood pressure 129/80, pulse 71, temperature 98.4 F (36.9 C), temperature source Oral, resp. rate 18, height 5\' 8"  (1.727 m), weight 84.9 kg, SpO2 97 %.  General: Pt awake/alert/oriented x4 in No acute distress Eyes: PERRL, normal EOM.  Sclera clear.  No icterus Neuro: CN II-XII intact w/o focal sensory/motor deficits. Lymph: No head/neck/groin lymphadenopathy Psych:  No delerium/psychosis/paranoia HENT: Normocephalic, Mucus membranes moist.  No thrush Neck: Supple, No tracheal deviation Chest:  No chest wall pain w good excursion CV:  Pulses intact.  Regular rhythm MS: Normal AROM mjr joints.  No obvious deformity Abdomen: Soft.  Mildly distended.  Nontender.  No evidence of peritonitis.  No incarcerated hernias. Incision sites clean and dry. Dressings appropriately placed.  Ext:  SCDs BLE.  No mjr edema.  No cyanosis Skin: No petechiae / purpura   Disposition:   Follow-up Information     Michael Boston, MD. Schedule an appointment as soon as possible for a visit in 3 weeks.   Specialty: General Surgery Why: To follow up after your operation, To follow up after your hospital stay Contact information: [place] [id number] [phone]            Discharge disposition: 01-Home or Self Care       Discharge Instructions     Call MD for:   Complete by: As directed    FEVER > 101.5 F  (temperatures < 101.5 F are not significant)   Call MD for:  extreme fatigue   Complete by: As directed    Call MD for:  persistant dizziness  or light-headedness   Complete by: As directed    Call MD for:  persistant nausea and vomiting   Complete by: As directed    Call MD for:  redness, tenderness, or signs of infection (pain, swelling, redness, odor or green/yellow discharge around incision site)   Complete by: As directed    Call MD for:  severe  uncontrolled pain   Complete by: As directed    Diet - low sodium heart healthy   Complete by: As directed    Start with a bland diet such as soups, liquids, starchy foods, low fat foods, etc. the first few days at home. Gradually advance to a solid, low-fat, high fiber diet by the end of the first week at home.   Add a fiber supplement to your diet (Metamucil, etc) If you feel full, bloated, or constipated, stay on a full liquid or pureed/blenderized diet for a few days until you feel better and are no longer constipated.   Discharge instructions   Complete by: As directed    See Discharge Instructions If you are not getting better after two weeks or are noticing you are getting worse, contact our office [phone] for further advice.  We may need to adjust your medications, re-evaluate you in the office, send you to the emergency room, or see what other things we can do to help. The clinic staff is available to answer your questions during regular business hours (8:30am-5pm).  Please don't hesitate to call and ask to speak to one of our nurses for clinical concerns.    A surgeon from [facility] is always on call at the hospitals 24 hours/day If you have a medical emergency, go to the nearest emergency room or call 911.   Discharge wound care:   Complete by: As directed    It is good for closed incisions and even open wounds to be washed every day.  Shower every day.  Short baths are fine.  Wash the incisions and wounds clean with soap & water.    You may leave closed incisions open to air if it is dry.   You may cover the incision with clean gauze & replace it after your daily shower for comfort.  REMOVE ALL DRESSINGS ON 4/9 Friday (POSTOP DAY #3)   Driving Restrictions   Complete by: As directed    You may drive when: - you are no longer taking narcotic prescription pain medication - you can comfortably wear a seatbelt - you can safely make sudden turns/stops without  pain.   Increase activity slowly   Complete by: As directed    Start light daily activities --- self-care, walking, climbing stairs- beginning the day after surgery.  Gradually increase activities as tolerated.  Control your pain to be active.  Stop when you are tired.  Ideally, walk several times a day, eventually an hour a day.   Most people are back to most day-to-day activities in a few weeks.  It takes 4-6 weeks to get back to unrestricted, intense activity. If you can walk 30 minutes without difficulty, it is safe to try more intense activity such as jogging, treadmill, bicycling, low-impact aerobics, swimming, etc. Save the most intensive and strenuous activity for last (Usually 4-8 weeks after surgery) such as sit-ups, heavy lifting, contact sports, etc.  Refrain from any intense heavy lifting or straining until you are off narcotics for pain control.  You will have off days, but things should improve week-by-week. DO NOT  PUSH THROUGH PAIN.  Let pain be your guide: If it hurts to do something, don't do it.   Lifting restrictions   Complete by: As directed    If you can walk 30 minutes without difficulty, it is safe to try more intense activity such as jogging, treadmill, bicycling, low-impact aerobics, swimming, etc. Save the most intensive and strenuous activity for last (Usually 4-8 weeks after surgery) such as sit-ups, heavy lifting, contact sports, etc.   Refrain from any intense heavy lifting or straining until you are off narcotics for pain control.  You will have off days, but things should improve week-by-week. DO NOT PUSH THROUGH PAIN.  Let pain be your guide: If it hurts to do something, don't do it.  Pain is your body warning you to avoid that activity for another week until the pain goes down.   May shower / Bathe   Complete by: As directed    May walk up steps   Complete by: As directed    Remove dressing in 72 hours   Complete by: As directed    Make sure all dressings are  removed by the third day after surgery.   REMOVE ALL DRESSINGS ON 4/9 Friday (POSTOP DAY #3) Leave incisions open to air.  OK to cover incisions with gauze or bandages as desired   Sexual Activity Restrictions   Complete by: As directed    You may have sexual intercourse when it is comfortable. If it hurts to do something, stop.       Allergies as of 08/12/2019   No Known Allergies      Medication List     TAKE these medications    gabapentin 300 MG capsule Commonly known as: NEURONTIN Take 300 mg by mouth at bedtime.   latanoprost 0.005 % ophthalmic solution Commonly known as: XALATAN Place 1 drop into both eyes at bedtime.               Discharge Care Instructions  (From admission, onward)           Start     Ordered   08/12/19 0000  Discharge wound care:    Comments: It is good for closed incisions and even open wounds to be washed every day.  Shower every day.  Short baths are fine.  Wash the incisions and wounds clean with soap & water.    You may leave closed incisions open to air if it is dry.   You may cover the incision with clean gauze & replace it after your daily shower for comfort.  REMOVE ALL DRESSINGS ON 4/9 Friday (POSTOP DAY #3)   08/12/19 0803            Significant Diagnostic Studies:  Results for orders placed or performed during the hospital encounter of 08/10/19 (from the past 72 hour(s))  Basic metabolic panel     Status: Abnormal   Collection Time: 08/11/19  4:42 AM  Result Value Ref Range   Sodium 139 135 - 145 mmol/L   Potassium 4.9 3.5 - 5.1 mmol/L   Chloride 107 98 - 111 mmol/L   CO2 26 22 - 32 mmol/L   Glucose, Bld 104 (H) 70 - 99 mg/dL    Comment: Glucose reference range applies only to samples taken after fasting for at least 8 hours.   BUN 13 8 - 23 mg/dL   Creatinine, Ser 0.74 0.61 - 1.24 mg/dL   Calcium 9.0 8.9 - 10.3 mg/dL   GFR  calc non Af Amer >60 >60 mL/min   GFR calc Af Amer >60 >60 mL/min   Anion gap 6 5 -  15    Comment: Performed at [facility], [place] [address] [place], [place] [address]  CBC     Status: Abnormal   Collection Time: 08/11/19  4:42 AM  Result Value Ref Range   WBC 8.7 4.0 - 10.5 K/uL   RBC 3.76 (L) 4.22 - 5.81 MIL/uL   Hemoglobin 12.3 (L) 13.0 - 17.0 g/dL   HCT 37.6 (L) 39.0 - 52.0 %   MCV 100.0 80.0 - 100.0 fL   MCH 32.7 26.0 - 34.0 pg   MCHC 32.7 30.0 - 36.0 g/dL   RDW 13.4 11.5 - 15.5 %   Platelets 188 150 - 400 K/uL   nRBC 0.0 0.0 - 0.2 %    Comment: Performed at [facility], [place] [address], [place], [place] [address]  Magnesium     Status: None   Collection Time: 08/11/19  4:42 AM  Result Value Ref Range   Magnesium 1.9 1.7 - 2.4 mg/dL    Comment: Performed at [facility], [place] [address], [place],  [address]    No results found.  Past Medical History:  Diagnosis Date   Aortic stenosis 07/02/2019   severe   Carcinoma of colon ([place]) dx'd 04/2019   Glaucoma    Heart murmur    Aortic stenosis   Lumbosacral stenosis    Restless leg     Past Surgical History:  Procedure Laterality Date   BACK SURGERY  2013   Stenosis in the back at [place] 2001 and 2013   HIP SURGERY Bilateral    2015 and 2016    Social History   Socioeconomic History   Marital status: Married    Spouse name: Not on file   Number of children: 3   Years of education: Not on file   Highest education level: Not on file  Occupational History   Occupation: Retired-Worked for [employer] Use   Smoking status: Never Smoker   Smokeless tobacco: Former Systems developer    Types: Chew  Substance and Sexual Activity   Alcohol use: Never   Drug use: Never   Sexual activity: Not on file  Other Topics Concern   Not on file  Social History Narrative   Not on file   Social Determinants of Health   Financial Resource Strain:    Difficulty of Paying Living Expenses:   Food Insecurity:      Worried About Charity fundraiser in the Last Year:    Arboriculturist in the Last Year:   Transportation Needs:    Film/video editor (Medical):    Lack of Transportation (Non-Medical):   Physical Activity:    Days of Exercise per Week:    Minutes of Exercise per Session:   Stress:    Feeling of Stress :   Social Connections:    Frequency of Communication with Friends and Family:    Frequency of Social Gatherings with Friends and Family:    Attends Religious Services:    Active Member of Clubs or Organizations:    Attends Archivist Meetings:    Marital Status:   Intimate Partner Violence:    Fear of Current or Ex-Partner:    Emotionally Abused:    Physically Abused:    Sexually  Abused:     Family History  Problem Relation Age of Onset   Arrhythmia Mother    Arthritis Mother    Heart attack Father    Colon cancer Father    Arthritis Father    Multiple sclerosis Sister     Current Facility-Administered Medications  Medication Dose Route Frequency Provider Last Rate Last Admin   0.9 %  sodium chloride infusion   Intravenous Q8H PRN Michael Boston, MD       0.9 %  sodium chloride infusion  250 mL Intravenous PRN Michael Boston, MD       acetaminophen (TYLENOL) tablet 1,000 mg  1,000 mg Oral Lajuana Ripple, MD   1,000 mg at 08/12/19 0615   alum & mag hydroxide-simeth (MAALOX/MYLANTA) 200-200-20 MG/5ML suspension 30 mL  30 mL Oral Q6H PRN Michael Boston, MD       Chlorhexidine Gluconate Cloth 2 % PADS 6 each  6 each Topical Daily Michael Boston, MD   6 each at 08/11/19 1038   diphenhydrAMINE (BENADRYL) 12.5 MG/5ML elixir 12.5 mg  12.5 mg Oral Q6H PRN Michael Boston, MD       Or   diphenhydrAMINE (BENADRYL) injection 12.5 mg  12.5 mg Intravenous Q6H PRN Michael Boston, MD       enalaprilat (VASOTEC) injection 0.625-1.25 mg  0.625-1.25 mg Intravenous Q6H PRN Michael Boston, MD       enoxaparin (LOVENOX) injection 40 mg  40 mg Subcutaneous Q24H Michael Boston, MD    40 mg at 08/12/19 [id number]   feeding supplement (ENSURE SURGERY) liquid 237 mL  237 mL Oral BID BM Michael Boston, MD   237 mL at 08/12/19 1033   gabapentin (NEURONTIN) capsule 200 mg  200 mg Oral TID Michael Boston, MD   200 mg at 08/12/19 1029   HYDROmorphone (DILAUDID) injection 0.5-2 mg  0.5-2 mg Intravenous Q4H PRN Michael Boston, MD       lactated ringers infusion   Intravenous Continuous Myrtie Soman, MD 50 mL/hr at 08/10/19 1129 Restarted at 08/10/19 1544   latanoprost (XALATAN) 0.005 % ophthalmic solution 1 drop  1 drop Both Eyes QHS Michael Boston, MD   1 drop at 08/11/19 2046   lip balm (CARMEX) ointment 1 application  1 application Topical BID Michael Boston, MD   1 application at [id number] 1033   magic mouthwash  15 mL Oral QID PRN Michael Boston, MD       metoprolol tartrate (LOPRESSOR) injection 5 mg  5 mg Intravenous Q6H PRN Michael Boston, MD       ondansetron (ZOFRAN) tablet 4 mg  4 mg Oral Q6H PRN Michael Boston, MD       Or   ondansetron (ZOFRAN) injection 4 mg  4 mg Intravenous Q6H PRN Michael Boston, MD       prochlorperazine (COMPAZINE) tablet 10 mg  10 mg Oral Q6H PRN Michael Boston, MD       Or   prochlorperazine (COMPAZINE) injection 5-10 mg  5-10 mg Intravenous Q6H PRN Michael Boston, MD       sodium chloride flush (NS) 0.9 % injection 3 mL  3 mL Intravenous Gorden Harms, MD   3 mL at 08/11/19 2047   sodium chloride flush (NS) 0.9 % injection 3 mL  3 mL Intravenous PRN Michael Boston, MD   3 mL at 08/10/19 2149   traMADol (ULTRAM) tablet 50-100 mg  50-100 mg Oral Q6H PRN Michael Boston, MD         No  Known Allergies  Signed: Fran Lowes PA-S  08/12/2019, 10:53 AM

## 2019-08-12 NOTE — Evaluation (Addendum)
Occupational Therapy Evaluation and Discharge  Patient Details Name: Richard Washington MRN: [id number] DOB: 1941-03-14 Today's Date: 08/12/2019    History of Present Illness 79 y.o. male admitted for robotic sigmoid colectomy 08/11/19. History of glaucoma, restless leg syndrome, melanoma. Had hematochezia and underwent colonoscopy. Sigmoid cancer revealed   Clinical Impression   PTA patient independent. Admitted or above and presenting near baseline level, supervision today for safety.  Patient demonstrating ability to complete LB ADLs with supervision, transfers with supervision (simulated tub transfers) and in room mobility with supervision.  Pt reports having RW and cane at home, but not using PTA; plans to put 3:1 commode over toilet for increased ease.  He has support of his spouse as needed. Educated on safety, ADL compensatory techniques for pain mgmt, and energy conservation.  Based on performance today, no further OT needs have been identified and OT will sign off.  Thank you for this referral.     Follow Up Recommendations  No OT follow up    Equipment Recommendations  None recommended by OT    Recommendations for Other Services       Precautions / Restrictions Restrictions Weight Bearing Restrictions: No      Mobility Bed Mobility               General bed mobility comments: OOB in recliner upon entry   Transfers Overall transfer level: Needs assistance Equipment used: None Transfers: Sit to/from Stand Sit to Stand: Supervision         General transfer comment: for safety     Balance Overall balance assessment: Mild deficits observed, not formally tested                                         ADL either performed or assessed with clinical judgement   ADL Overall ADL's : Needs assistance/impaired     Grooming: Supervision/safety;Standing   Upper Body Bathing: Set up;Sitting   Lower Body Bathing: Supervison/ safety;Sit to/from  stand Lower Body Bathing Details (indicate cue type and reason): simulated, using grabbars for LEs  Upper Body Dressing : Set up;Sitting   Lower Body Dressing: Supervision/safety;Sit to/from stand;Cueing for compensatory techniques Lower Body Dressing Details (indicate cue type and reason): figure 4 technique to reach feet for socks, supervision donning pants standing with 1 UE support Toilet Transfer: Supervision/safety;Ambulation Toilet Transfer Details (indicate cue type and reason): simulated in room     Tub/ Shower Transfer: Tub transfer;Supervision/safety;Ambulation;Grab bars Tub/Shower Transfer Details (indicate cue type and reason): simulated in room Functional mobility during ADLs: Supervision/safety       Vision         Perception     Praxis      Pertinent Vitals/Pain Pain Assessment: Faces Faces Pain Scale: Hurts a little bit Pain Location: abdomen, surgical site Pain Descriptors / Indicators: Discomfort Pain Intervention(s): Monitored during session;Repositioned;Limited activity within patient's tolerance     Hand Dominance     Extremity/Trunk Assessment Upper Extremity Assessment Upper Extremity Assessment: Overall WFL for tasks assessed   Lower Extremity Assessment Lower Extremity Assessment: Defer to PT evaluation   Cervical / Trunk Assessment Cervical / Trunk Assessment: Kyphotic   Communication Communication Communication: No difficulties   Cognition Arousal/Alertness: Awake/alert Behavior During Therapy: WFL for tasks assessed/performed Overall Cognitive Status: Within Functional Limits for tasks assessed  General Comments       Exercises     Shoulder Instructions      Home Living Family/patient expects to be discharged to:: Private residence Living Arrangements: Spouse/significant other Available Help at Discharge: Family Type of Home: House Home Access: Stairs to enter;Ramped  entrance Entrance Stairs-Number of Steps: 1 Entrance Stairs-Rails: None Home Layout: One level     Bathroom Shower/Tub: Teacher, early years/pre: [employer]: Environmental consultant - 2 wheels;Cane - single point;Bedside commode          Prior Functioning/Environment Level of Independence: Independent        Comments: driving, yardwork        OT Problem List:        OT Treatment/Interventions:      OT Goals(Current goals can be found in the care plan section) Acute Rehab OT Goals Patient Stated Goal: home today  OT Goal Formulation: With patient  OT Frequency:     Barriers to D/C:            Co-evaluation              AM-PAC OT "6 Clicks" Daily Activity     Outcome Measure Help from another person eating meals?: None Help from another person taking care of personal grooming?: A Little Help from another person toileting, which includes using toliet, bedpan, or urinal?: A Little Help from another person bathing (including washing, rinsing, drying)?: A Little Help from another person to put on and taking off regular upper body clothing?: A Little Help from another person to put on and taking off regular lower body clothing?: A Little 6 Click Score: 19   End of Session Nurse Communication: Mobility status  Activity Tolerance: Patient tolerated treatment well Patient left: in chair;with call bell/phone within reach  OT Visit Diagnosis: Pain;Other abnormalities of gait and mobility (R26.89) Pain - part of body: (stomach)                Time: [id number] OT Time Calculation (min): 19 min Charges:  OT General Charges $OT Visit: 1 Visit OT Evaluation $OT Eval Low Complexity: 1 Low  Jolaine Artist, OT Acute Rehabilitation Services Pager [phone] Office [phone]    Delight Stare 08/12/2019, 8:58 AM

## 2019-08-13 LAB — SURGICAL PATHOLOGY

## 2019-08-20 ENCOUNTER — Encounter (HOSPITAL_COMMUNITY): Payer: Self-pay

## 2019-08-20 ENCOUNTER — Telehealth: Payer: Self-pay

## 2019-08-20 NOTE — Telephone Encounter (Signed)
Called patient to check on him post surgery per Dr. Quintella Reichert request. No answer. LMTCB

## 2019-08-20 NOTE — Telephone Encounter (Signed)
No message needed °

## 2019-08-20 NOTE — Telephone Encounter (Signed)
-----   Message from Berniece Salines, DO sent at 08/20/2019  8:04 AM EDT ----- Patient is post surgery, Please check on the patient and see how he is recovering from surgery.    Kardie Tobb

## 2019-08-20 NOTE — Telephone Encounter (Signed)
Called patient per Dr. Quintella Reichert request to check on him post surgery. He states he is feeling good and has no concerns at this time. I advised him to call the office back if any new concerns come up.

## 2019-08-25 ENCOUNTER — Other Ambulatory Visit: Payer: Self-pay

## 2019-08-26 DIAGNOSIS — C187 Malignant neoplasm of sigmoid colon: Secondary | ICD-10-CM | POA: Diagnosis not present

## 2019-09-01 ENCOUNTER — Telehealth: Payer: Self-pay | Admitting: *Deleted

## 2019-09-01 ENCOUNTER — Encounter: Payer: Self-pay | Admitting: Cardiovascular Disease

## 2019-09-01 DIAGNOSIS — C187 Malignant neoplasm of sigmoid colon: Secondary | ICD-10-CM | POA: Diagnosis not present

## 2019-09-01 NOTE — Telephone Encounter (Signed)
Error. No note needed  

## 2019-09-01 NOTE — Telephone Encounter (Signed)
Burnell Blanks, MD  Barkley Boards, RN  It should be ok to start his oral chemotherapy while we are waiting for TAVR. Thanks.       Previous Messages   ----- Message -----  From: Barkley Boards, RN  Sent: 08/31/2019  2:47 PM EDT  To: Barkley Boards, RN, Burnell Blanks, MD, *  Subject: AS Follow-up                   Hey,   You had wanted to see Mr Ingerson back in May to discuss his AS further following his colon Sx. He is doing well and was cleared by Dr Johney Maine today. He is being seen by oncology at [facility] in [place] and is scheduled for a meeting tomorrow to discuss taking oral chemo for 6 months. Oncology is asking for the ok from [place] to begin this treatment. At this time the pt appears to be asymptomatic from his AS. The Oncology Navigator is Marden Noble [phone] if you need to speak with the oncologist (the oncologist he saw was leaving and a new doc was starting) to discuss the medication. I am happy to call if you are comfortable with the pt proceeding. Just let me know when you may want to see him back.   Thanks,  Jaynee Winters     The oncology navigator contacted me by phone, Estill Bamberg, and I made her aware that Dr Angelena Form is okay with the pt proceeding with oral chemotherapy at this time.  This note was faxed to [facility] at [phone].

## 2019-09-01 NOTE — Telephone Encounter (Signed)
Call from patient's medical oncologist, Dr. Bangladesh, requesting to speak with Dr. Angelena Form. Pt has had surgery for a colon cancer and needs oral chemo for 6 months.  This would need to be started in the first 8-12 weeks after surgery to be most effective.  She wants to know if he can wait until after this chemo before aortic valve surgery.  I adv I would forward this message to Dr. Angelena Form and that she will be called back with his recommendations.

## 2019-10-05 DIAGNOSIS — C187 Malignant neoplasm of sigmoid colon: Secondary | ICD-10-CM | POA: Diagnosis not present

## 2019-10-26 DIAGNOSIS — C187 Malignant neoplasm of sigmoid colon: Secondary | ICD-10-CM | POA: Diagnosis not present

## 2019-10-26 DIAGNOSIS — W57XXXA Bitten or stung by nonvenomous insect and other nonvenomous arthropods, initial encounter: Secondary | ICD-10-CM | POA: Diagnosis not present

## 2019-10-26 DIAGNOSIS — R21 Rash and other nonspecific skin eruption: Secondary | ICD-10-CM | POA: Diagnosis not present

## 2019-11-16 DIAGNOSIS — C187 Malignant neoplasm of sigmoid colon: Secondary | ICD-10-CM | POA: Diagnosis not present

## 2019-12-07 DIAGNOSIS — C187 Malignant neoplasm of sigmoid colon: Secondary | ICD-10-CM | POA: Diagnosis not present

## 2019-12-07 DIAGNOSIS — R21 Rash and other nonspecific skin eruption: Secondary | ICD-10-CM | POA: Diagnosis not present

## 2019-12-28 DIAGNOSIS — R21 Rash and other nonspecific skin eruption: Secondary | ICD-10-CM | POA: Diagnosis not present

## 2019-12-28 DIAGNOSIS — C187 Malignant neoplasm of sigmoid colon: Secondary | ICD-10-CM | POA: Diagnosis not present

## 2020-01-18 DIAGNOSIS — C187 Malignant neoplasm of sigmoid colon: Secondary | ICD-10-CM | POA: Diagnosis not present

## 2020-02-10 DIAGNOSIS — C187 Malignant neoplasm of sigmoid colon: Secondary | ICD-10-CM | POA: Diagnosis not present

## 2020-02-15 ENCOUNTER — Telehealth: Payer: Self-pay | Admitting: Physician Assistant

## 2020-02-15 NOTE — Telephone Encounter (Signed)
Thanks

## 2020-02-15 NOTE — Telephone Encounter (Signed)
  [facility]   MULTIDISCIPLINARY HEART VALVE TEAM   Pt was seen by Dr Angelena Form in 06/2019 for TAVR consult. This was around the same time he was diagnosed with colon cancer and he was felt to be asymptomatic in terms of his aortic stenosis. Plan was made for proceeding with colon surgery and chemo with follow up 3-4 weeks after surgery with structural heart. I called the pt to discuss bringing him in to see Dr. Angelena Form. The patient said he was doing quite well and did not want to consider coming in for an apt through the rest of this year. He was agreeable to [place] calling him back in Jan 2022 to discuss setting up a structural heart check in. Will need to set up a repeat echo a that time.   Angelena Form PA-C  [facility]

## 2020-02-22 DIAGNOSIS — C187 Malignant neoplasm of sigmoid colon: Secondary | ICD-10-CM | POA: Diagnosis not present

## 2020-02-22 DIAGNOSIS — C19 Malignant neoplasm of rectosigmoid junction: Secondary | ICD-10-CM | POA: Diagnosis not present

## 2020-02-29 DIAGNOSIS — C187 Malignant neoplasm of sigmoid colon: Secondary | ICD-10-CM | POA: Diagnosis not present

## 2020-03-21 DIAGNOSIS — Z79899 Other long term (current) drug therapy: Secondary | ICD-10-CM | POA: Diagnosis not present

## 2020-03-21 DIAGNOSIS — C187 Malignant neoplasm of sigmoid colon: Secondary | ICD-10-CM | POA: Diagnosis not present

## 2020-04-21 DIAGNOSIS — M79604 Pain in right leg: Secondary | ICD-10-CM | POA: Diagnosis not present

## 2020-04-21 DIAGNOSIS — M79605 Pain in left leg: Secondary | ICD-10-CM | POA: Diagnosis not present

## 2020-04-21 DIAGNOSIS — Z79899 Other long term (current) drug therapy: Secondary | ICD-10-CM | POA: Diagnosis not present

## 2020-04-21 DIAGNOSIS — C187 Malignant neoplasm of sigmoid colon: Secondary | ICD-10-CM | POA: Diagnosis not present

## 2020-05-29 DIAGNOSIS — H401131 Primary open-angle glaucoma, bilateral, mild stage: Secondary | ICD-10-CM | POA: Diagnosis not present

## 2020-06-15 ENCOUNTER — Telehealth: Payer: Self-pay

## 2020-06-15 NOTE — Telephone Encounter (Signed)
°  [facility]   MULTIDISCIPLINARY HEART VALVE TEAM  Pt due to schedule Structural Heart follow-up with Dr Angelena Form. I have left the pt a message to contact me to arrange appointment and echocardiogram.

## 2020-07-03 NOTE — Telephone Encounter (Signed)
  [facility]   MULTIDISCIPLINARY HEART VALVE TEAM   Pt due to schedule Structural Heart follow-up with Dr Angelena Form. I have left the pt a message to contact me to arrange appointment and echocardiogram.

## 2020-07-12 ENCOUNTER — Telehealth: Payer: Self-pay

## 2020-07-12 NOTE — Telephone Encounter (Signed)
  [facility]   MULTIDISCIPLINARY HEART VALVE TEAM  The pt's wife called to make me aware that the pt does not wish to schedule follow-up with the heart valve team at this time.  The pt is followed by oncology on a regular basis and he has been feeling well and denies any problems.  I asked Mrs Traynham if the pt has had any follow-up with cardiology since we evaluated him February 2021 and she said no.  I advised her of the importance of having the pt follow-up with cardiology on a routine basis especially with his diagnosis of severe aortic stenosis.  I reviewed symptoms that the pt should monitor for and if these develop he should seek medical treatment. She verbalized understanding and agreed with plan.

## 2020-07-12 NOTE — Telephone Encounter (Signed)
Thanks

## 2020-07-17 DIAGNOSIS — K449 Diaphragmatic hernia without obstruction or gangrene: Secondary | ICD-10-CM | POA: Diagnosis not present

## 2020-07-17 DIAGNOSIS — C187 Malignant neoplasm of sigmoid colon: Secondary | ICD-10-CM | POA: Diagnosis not present

## 2020-07-17 DIAGNOSIS — I700 Atherosclerosis of aorta: Secondary | ICD-10-CM | POA: Diagnosis not present

## 2020-07-17 DIAGNOSIS — C19 Malignant neoplasm of rectosigmoid junction: Secondary | ICD-10-CM | POA: Diagnosis not present

## 2020-07-31 DIAGNOSIS — M79604 Pain in right leg: Secondary | ICD-10-CM | POA: Diagnosis not present

## 2020-07-31 DIAGNOSIS — M79605 Pain in left leg: Secondary | ICD-10-CM | POA: Diagnosis not present

## 2020-07-31 DIAGNOSIS — C187 Malignant neoplasm of sigmoid colon: Secondary | ICD-10-CM | POA: Diagnosis not present

## 2020-10-17 DIAGNOSIS — K449 Diaphragmatic hernia without obstruction or gangrene: Secondary | ICD-10-CM | POA: Diagnosis not present

## 2020-10-17 DIAGNOSIS — N2889 Other specified disorders of kidney and ureter: Secondary | ICD-10-CM | POA: Diagnosis not present

## 2020-10-17 DIAGNOSIS — K551 Chronic vascular disorders of intestine: Secondary | ICD-10-CM | POA: Diagnosis not present

## 2020-10-17 DIAGNOSIS — C187 Malignant neoplasm of sigmoid colon: Secondary | ICD-10-CM | POA: Diagnosis not present

## 2020-10-17 DIAGNOSIS — C19 Malignant neoplasm of rectosigmoid junction: Secondary | ICD-10-CM | POA: Diagnosis not present

## 2020-10-25 DIAGNOSIS — C187 Malignant neoplasm of sigmoid colon: Secondary | ICD-10-CM | POA: Diagnosis not present

## 2020-11-20 IMAGING — CT CT CHEST W/ CM
2 of 4 series · 14 of 36 positions shown, 17 images · IV contrast (OMNIPAQUE)
Comparison: CT abdomen pelvis, 06/17/2019

CLINICAL DATA: Colon cancer staging

EXAM:
CT CHEST WITH CONTRAST
TECHNIQUE: Multidetector CT imaging of the chest was performed during
intravenous contrast administration.
CONTRAST:  75mL OMNIPAQUE IOHEXOL 300 MG/ML  SOLN

[Series 2: axial st · axial · 0.83mm/px · z∈[+1257,+1499]mm · 11 of 143 slices shown, 14 images]
[im 11/143  mediastinal]
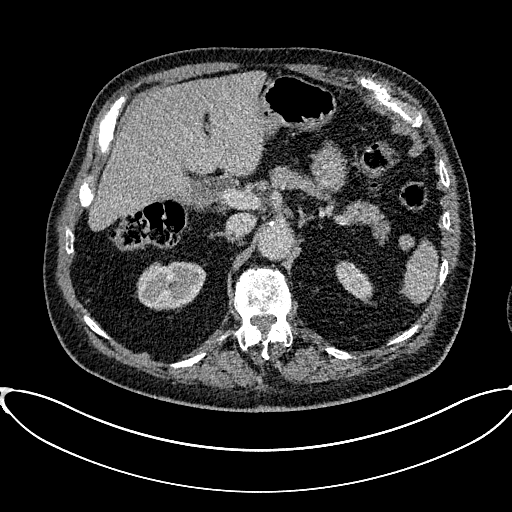
[im 11/143  lung]
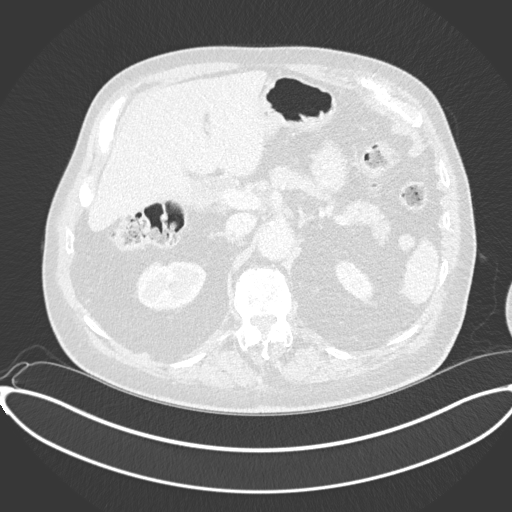
[im 21/143  lung]
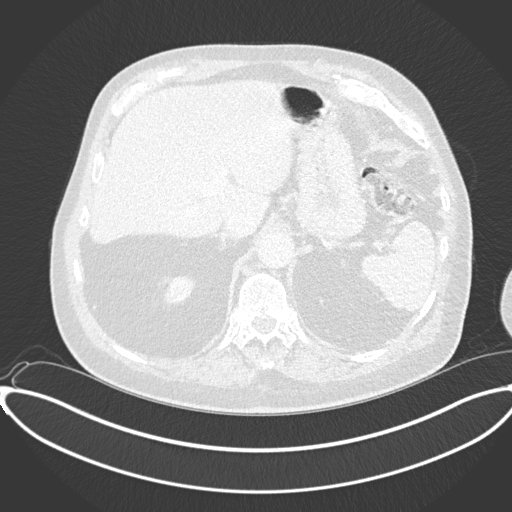
[im 31/143  lung]
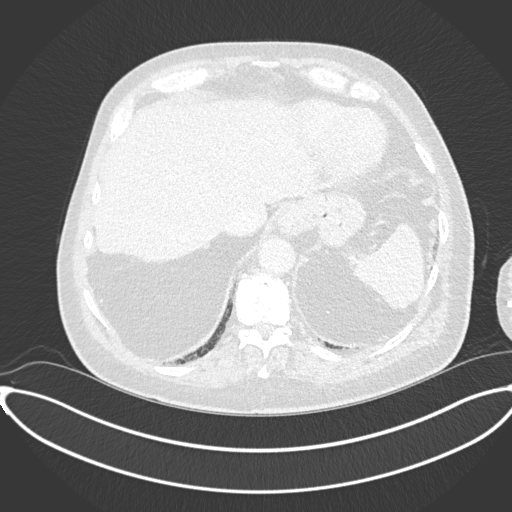
[im 51/143  lung]
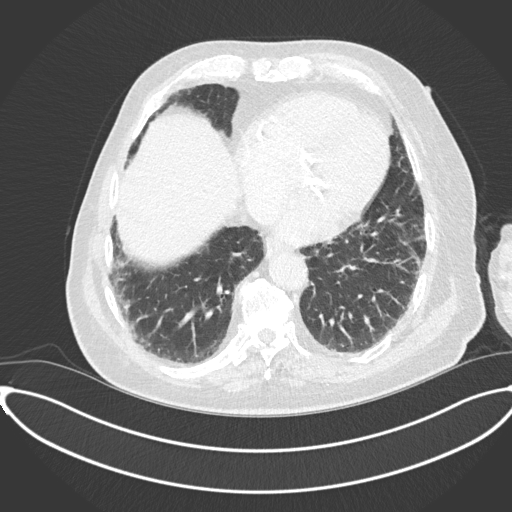
[im 61/143  mediastinal]
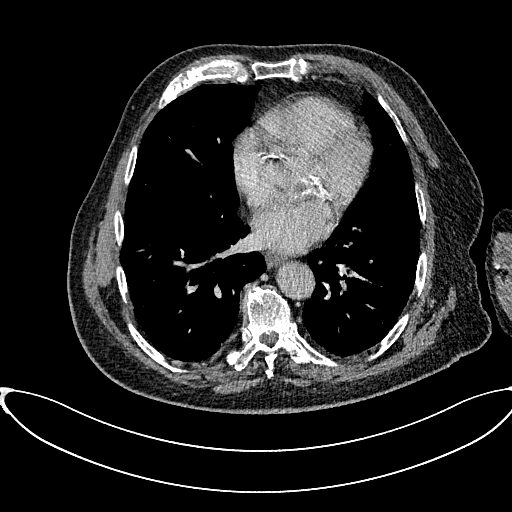
[im 61/143  lung]
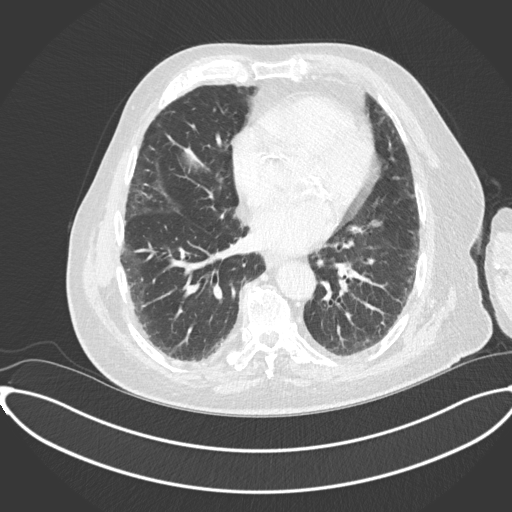
[im 72/143  lung]
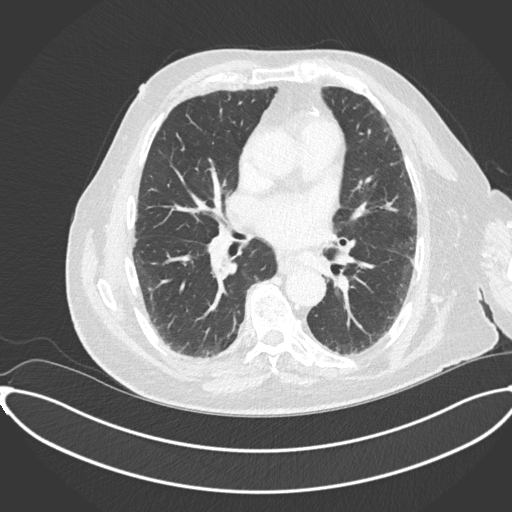
[im 82/143  lung]
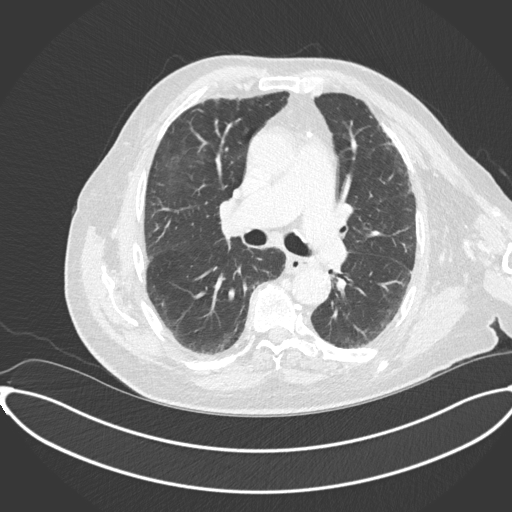
[im 92/143  lung]
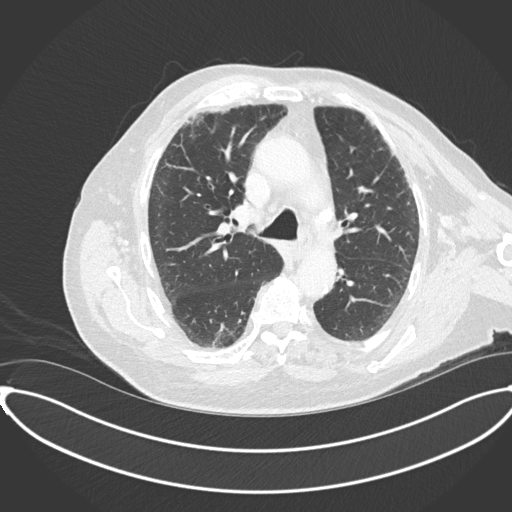
[im 112/143  mediastinal]
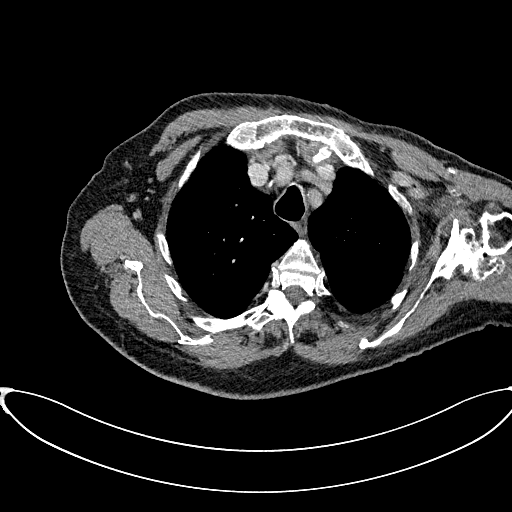
[im 112/143  lung]
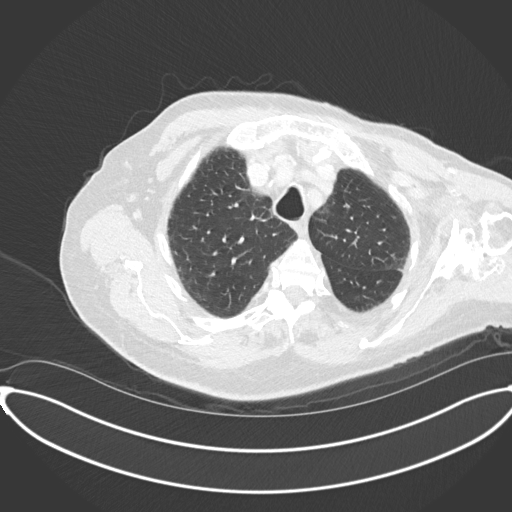
[im 122/143  lung]
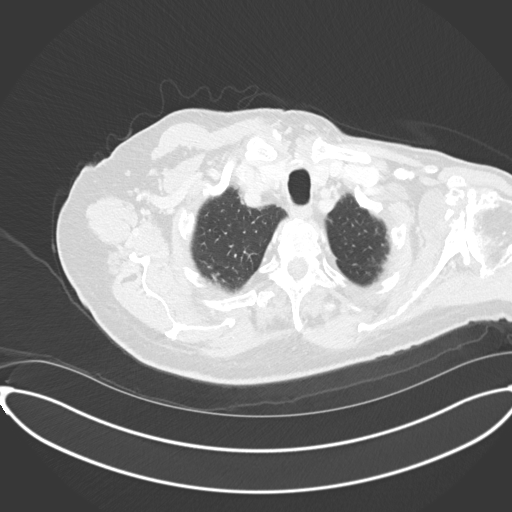
[im 132/143  lung]
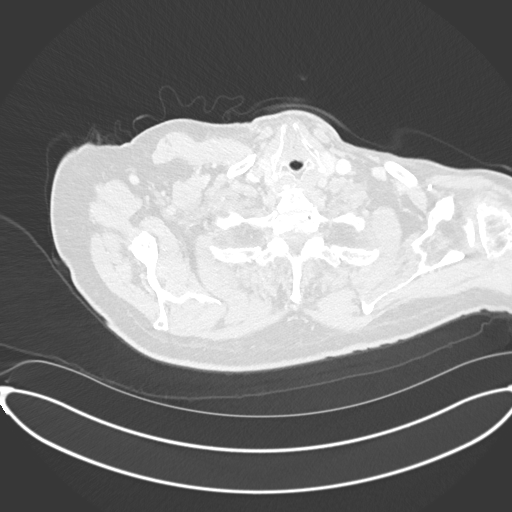

[Series 5: coronal · coronal · 0.58mm/px · 3 of 153 slices shown]
[im 31/153  lung]
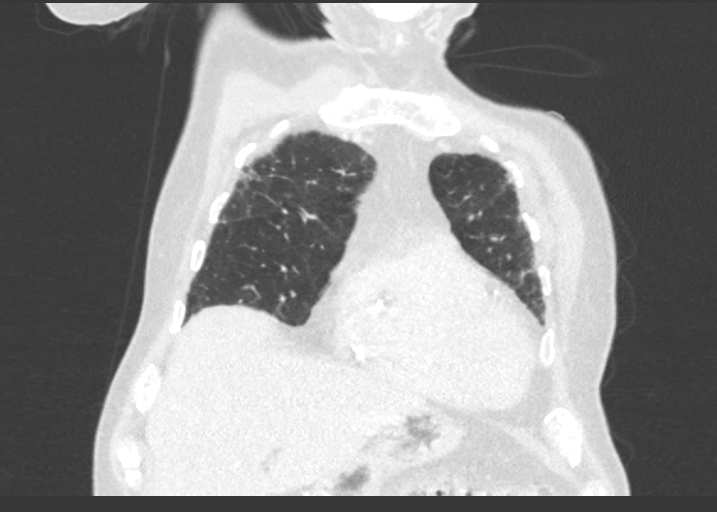
[im 61/153  lung]
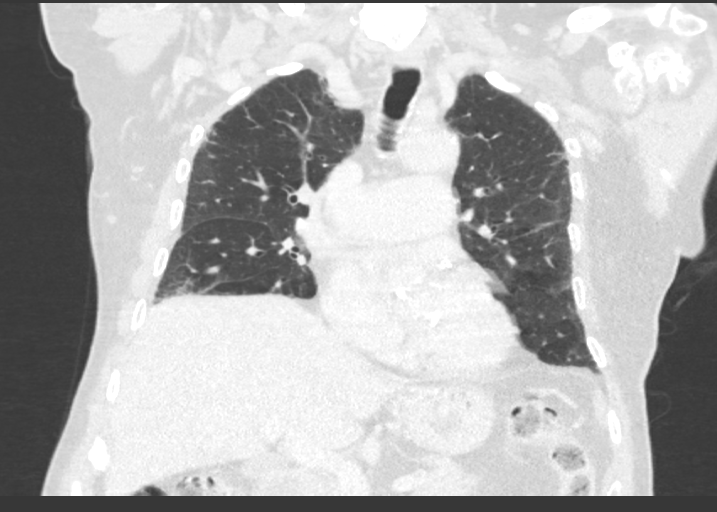
[im 92/153  lung]
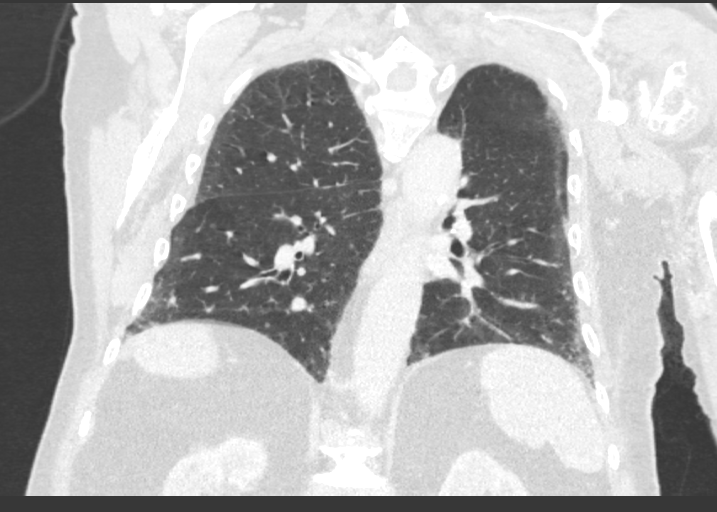

[14 of 36 positions shown; findings below may reference images not displayed]

FINDINGS: Cardiovascular: Aortic atherosclerosis. Normal heart size.
Three-vessel coronary artery calcifications. No pericardial
effusion.

Mediastinum/Nodes: No enlarged mediastinal, hilar, or axillary lymph
nodes. Thyroid gland, trachea, and esophagus demonstrate no
significant findings.

Lungs/Pleura: There is peripheral irregular interstitial opacity and
ground-glass with a slight apical to basal gradient. No significant
bronchiectasis the or bronchiolectasis. There is a 6 mm pulmonary
nodule of the inferior right upper lobe which abuts the minor
fissure, very likely a benign fissural nodule or lymph node (series
7, image 66). No pleural effusion or pneumothorax.

Upper Abdomen: No acute abnormality.

Musculoskeletal: No chest wall mass or suspicious bone lesions
identified. Severe bilateral glenohumeral arthrosis. Disc
degenerative disease of the thoracic spine with exaggerated
kyphosis.
IMPRESSION: 1. There is a 6 mm pulmonary nodule of the inferior right upper lobe
which abuts the minor fissure, very likely a benign fissural nodule
or lymph node. No definite evidence of metastatic disease in the
chest. Attention on follow-up.
2. Peripheral irregular interstitial opacity and ground-glass with a
slight apical to basal gradient. No significant bronchiectasis or
bronchiolectasis. Findings are consistent with mild pulmonary
fibrosis in an "indeterminate for UIP pattern" by ATS pulmonary
fibrosis criteria. Consider pulmonary referral and attention on
follow-up for stability of pattern and fibrotic findings. Findings
are indeterminate for UIP per consensus guidelines: Diagnosis of
Idiopathic Pulmonary Fibrosis: An Official ATS/ERS/JRS/ALAT Clinical
[id number][DATE].
3. Coronary artery disease. Aortic Atherosclerosis ([id number]).

## 2021-05-10 DIAGNOSIS — C187 Malignant neoplasm of sigmoid colon: Secondary | ICD-10-CM | POA: Diagnosis not present

## 2021-05-10 DIAGNOSIS — M79605 Pain in left leg: Secondary | ICD-10-CM | POA: Diagnosis not present

## 2021-05-10 DIAGNOSIS — M79604 Pain in right leg: Secondary | ICD-10-CM | POA: Diagnosis not present

## 2021-05-10 DIAGNOSIS — R011 Cardiac murmur, unspecified: Secondary | ICD-10-CM | POA: Diagnosis not present

## 2021-05-28 DIAGNOSIS — H401131 Primary open-angle glaucoma, bilateral, mild stage: Secondary | ICD-10-CM | POA: Diagnosis not present

## 2021-10-17 DIAGNOSIS — C187 Malignant neoplasm of sigmoid colon: Secondary | ICD-10-CM | POA: Diagnosis not present

## 2021-10-17 DIAGNOSIS — K760 Fatty (change of) liver, not elsewhere classified: Secondary | ICD-10-CM | POA: Diagnosis not present

## 2021-10-17 DIAGNOSIS — J8410 Pulmonary fibrosis, unspecified: Secondary | ICD-10-CM | POA: Diagnosis not present

## 2021-10-17 DIAGNOSIS — C189 Malignant neoplasm of colon, unspecified: Secondary | ICD-10-CM | POA: Diagnosis not present

## 2021-10-17 DIAGNOSIS — K449 Diaphragmatic hernia without obstruction or gangrene: Secondary | ICD-10-CM | POA: Diagnosis not present

## 2021-10-29 DIAGNOSIS — M79605 Pain in left leg: Secondary | ICD-10-CM | POA: Diagnosis not present

## 2021-10-29 DIAGNOSIS — M79604 Pain in right leg: Secondary | ICD-10-CM | POA: Diagnosis not present

## 2021-10-29 DIAGNOSIS — C187 Malignant neoplasm of sigmoid colon: Secondary | ICD-10-CM | POA: Diagnosis not present

## 2021-11-23 DIAGNOSIS — H401131 Primary open-angle glaucoma, bilateral, mild stage: Secondary | ICD-10-CM | POA: Diagnosis not present

## 2022-05-09 DIAGNOSIS — C187 Malignant neoplasm of sigmoid colon: Secondary | ICD-10-CM | POA: Diagnosis not present

## 2022-11-04 DIAGNOSIS — C189 Malignant neoplasm of colon, unspecified: Secondary | ICD-10-CM | POA: Diagnosis not present

## 2022-11-04 DIAGNOSIS — C187 Malignant neoplasm of sigmoid colon: Secondary | ICD-10-CM | POA: Diagnosis not present

## 2022-11-14 DIAGNOSIS — M79604 Pain in right leg: Secondary | ICD-10-CM | POA: Diagnosis not present

## 2022-11-14 DIAGNOSIS — Z85038 Personal history of other malignant neoplasm of large intestine: Secondary | ICD-10-CM | POA: Diagnosis not present

## 2022-11-14 DIAGNOSIS — M79605 Pain in left leg: Secondary | ICD-10-CM | POA: Diagnosis not present

## 2022-11-14 DIAGNOSIS — Z08 Encounter for follow-up examination after completed treatment for malignant neoplasm: Secondary | ICD-10-CM | POA: Diagnosis not present

## 2023-04-06 DEATH — deceased
